# Patient Record
Sex: Male | Born: 1955
Health system: Southern US, Community
[De-identification: ages and names within clinical notes are randomized; demographics above are authoritative.]

## PROBLEM LIST (undated history)

## (undated) DIAGNOSIS — F419 Anxiety disorder, unspecified: Secondary | ICD-10-CM

## (undated) DIAGNOSIS — F32A Depression, unspecified: Secondary | ICD-10-CM

## (undated) DIAGNOSIS — F99 Mental disorder, not otherwise specified: Secondary | ICD-10-CM

## (undated) DIAGNOSIS — R569 Unspecified convulsions: Secondary | ICD-10-CM

## (undated) DIAGNOSIS — F329 Major depressive disorder, single episode, unspecified: Secondary | ICD-10-CM

## (undated) HISTORY — PX: HERNIA REPAIR: SHX51

---

## 2003-02-23 ENCOUNTER — Emergency Department (HOSPITAL_COMMUNITY): Admission: EM | Admit: 2003-02-23 | Discharge: 2003-02-23 | Payer: Self-pay | Admitting: Emergency Medicine

## 2010-06-27 ENCOUNTER — Inpatient Hospital Stay (HOSPITAL_COMMUNITY)
Admission: EM | Admit: 2010-06-27 | Discharge: 2010-07-01 | DRG: 580 | Disposition: A | Discharge status: Home / Self Care | Payer: Self-pay | Attending: Otolaryngology | Admitting: Otolaryngology

## 2010-06-27 ENCOUNTER — Emergency Department (HOSPITAL_COMMUNITY): Payer: Self-pay

## 2010-06-27 DIAGNOSIS — S21109A Unspecified open wound of unspecified front wall of thorax without penetration into thoracic cavity, initial encounter: Principal | ICD-10-CM | POA: Diagnosis present

## 2010-06-27 DIAGNOSIS — S0120XA Unspecified open wound of nose, initial encounter: Secondary | ICD-10-CM | POA: Diagnosis present

## 2010-06-27 DIAGNOSIS — Z5900 Homelessness unspecified: Secondary | ICD-10-CM

## 2010-06-27 DIAGNOSIS — S0180XA Unspecified open wound of other part of head, initial encounter: Secondary | ICD-10-CM | POA: Diagnosis present

## 2010-06-27 DIAGNOSIS — Y998 Other external cause status: Secondary | ICD-10-CM

## 2010-06-27 DIAGNOSIS — S1190XA Unspecified open wound of unspecified part of neck, initial encounter: Secondary | ICD-10-CM | POA: Diagnosis present

## 2010-06-27 DIAGNOSIS — S01501A Unspecified open wound of lip, initial encounter: Secondary | ICD-10-CM | POA: Diagnosis present

## 2010-06-27 DIAGNOSIS — S41009A Unspecified open wound of unspecified shoulder, initial encounter: Secondary | ICD-10-CM | POA: Diagnosis present

## 2010-06-27 DIAGNOSIS — D62 Acute posthemorrhagic anemia: Secondary | ICD-10-CM | POA: Diagnosis present

## 2010-06-27 DIAGNOSIS — F341 Dysthymic disorder: Secondary | ICD-10-CM | POA: Diagnosis present

## 2010-06-27 DIAGNOSIS — F1910 Other psychoactive substance abuse, uncomplicated: Secondary | ICD-10-CM | POA: Diagnosis present

## 2010-06-27 DIAGNOSIS — F411 Generalized anxiety disorder: Secondary | ICD-10-CM | POA: Diagnosis present

## 2010-06-27 LAB — COMPREHENSIVE METABOLIC PANEL
AST: 74 U/L — ABNORMAL HIGH (ref 0–37)
BUN: 6 mg/dL (ref 6–23)
CO2: 22 mEq/L (ref 19–32)
Calcium: 8.5 mg/dL (ref 8.4–10.5)
Chloride: 105 mEq/L (ref 96–112)
Creatinine, Ser: 0.91 mg/dL (ref 0.4–1.5)
GFR calc Af Amer: >60 mL/min (ref >60)
GFR calc non Af Amer: >60 mL/min (ref >60)
Total Bilirubin: 0.5 mg/dL (ref 0.3–1.2)

## 2010-06-27 LAB — POCT I-STAT, CHEM 8
BUN: 6 mg/dL (ref 6–23)
Calcium, Ion: 0.99 mmol/L — ABNORMAL LOW (ref 1.12–1.32)
Chloride: 106 meq/L (ref 96–112)
HCT: 37 % — ABNORMAL LOW (ref 39.0–52.0)
Sodium: 140 meq/L (ref 135–145)
TCO2: 22 mmol/L (ref 0–100)

## 2010-06-27 LAB — DIFFERENTIAL
Basophils Absolute: 0 10*3/uL (ref 0.0–0.1)
Basophils Relative: 0 % (ref 0–1)
Lymphocytes Relative: 15 % (ref 12–46)
Neutro Abs: 5.9 10*3/uL (ref 1.7–7.7)
Neutrophils Relative %: 77 % (ref 43–77)

## 2010-06-27 LAB — CBC
HCT: 27.2 % — ABNORMAL LOW (ref 39.0–52.0)
Hemoglobin: 9.2 g/dL — ABNORMAL LOW (ref 13.0–17.0)
Platelets: 123 10*3/uL — ABNORMAL LOW (ref 150–400)
RBC: 4.2 MIL/uL — ABNORMAL LOW (ref 4.22–5.81)
RBC: 3.11 MIL/uL — ABNORMAL LOW (ref 4.22–5.81)
RDW: 12.8 % (ref 11.5–15.5)
WBC: 6.9 10*3/uL (ref 4.0–10.5)
WBC: 7.7 10*3/uL (ref 4.0–10.5)

## 2010-06-27 LAB — BASIC METABOLIC PANEL
CO2: 23 mEq/L (ref 19–32)
Calcium: 7.5 mg/dL — ABNORMAL LOW (ref 8.4–10.5)
Chloride: 108 mEq/L (ref 96–112)
GFR calc Af Amer: >60 mL/min (ref >60)
Glucose, Bld: 120 mg/dL — ABNORMAL HIGH (ref 70–99)
Potassium: 4.6 mEq/L (ref 3.5–5.1)
Sodium: 135 mEq/L (ref 135–145)

## 2010-06-27 LAB — PROTIME-INR
INR: 1.13 (ref 0.00–1.49)
Prothrombin Time: 14.7 seconds (ref 11.6–15.2)

## 2010-06-27 LAB — ETHANOL: Alcohol, Ethyl (B): 28 mg/dL — ABNORMAL HIGH (ref 0–10)

## 2010-06-28 LAB — POCT I-STAT 4, (NA,K, GLUC, HGB,HCT)
Potassium: 3.9 meq/L (ref 3.5–5.1)
Sodium: 141 meq/L (ref 135–145)

## 2010-06-28 LAB — CBC
Hemoglobin: 9.8 g/dL — ABNORMAL LOW (ref 13.0–17.0)
MCH: 30.5 pg (ref 26.0–34.0)
MCHC: 34.4 g/dL (ref 30.0–36.0)
MCV: 88.8 fL (ref 78.0–100.0)
RBC: 3.21 MIL/uL — ABNORMAL LOW (ref 4.22–5.81)
RDW: 13.2 % (ref 11.5–15.5)
WBC: 10.5 10*3/uL (ref 4.0–10.5)

## 2010-07-01 LAB — TYPE AND SCREEN
ABO/RH(D): O POS
Unit division: 0
Unit division: 0
Unit division: 0
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
## 2012-05-01 ENCOUNTER — Emergency Department (HOSPITAL_COMMUNITY): Payer: Self-pay

## 2012-05-01 ENCOUNTER — Emergency Department (HOSPITAL_COMMUNITY)
Admission: EM | Admit: 2012-05-01 | Discharge: 2012-05-01 | Disposition: A | Discharge status: Home / Self Care | Payer: Self-pay | Attending: Emergency Medicine | Admitting: Emergency Medicine

## 2012-05-01 ENCOUNTER — Encounter (HOSPITAL_COMMUNITY): Payer: Self-pay | Admitting: *Deleted

## 2012-05-01 DIAGNOSIS — F17200 Nicotine dependence, unspecified, uncomplicated: Secondary | ICD-10-CM | POA: Insufficient documentation

## 2012-05-01 DIAGNOSIS — Z8659 Personal history of other mental and behavioral disorders: Secondary | ICD-10-CM | POA: Insufficient documentation

## 2012-05-01 DIAGNOSIS — Y929 Unspecified place or not applicable: Secondary | ICD-10-CM | POA: Insufficient documentation

## 2012-05-01 DIAGNOSIS — R42 Dizziness and giddiness: Secondary | ICD-10-CM | POA: Insufficient documentation

## 2012-05-01 DIAGNOSIS — W19XXXA Unspecified fall, initial encounter: Secondary | ICD-10-CM

## 2012-05-01 DIAGNOSIS — Y939 Activity, unspecified: Secondary | ICD-10-CM | POA: Insufficient documentation

## 2012-05-01 DIAGNOSIS — S40019A Contusion of unspecified shoulder, initial encounter: Secondary | ICD-10-CM | POA: Insufficient documentation

## 2012-05-01 DIAGNOSIS — W108XXA Fall (on) (from) other stairs and steps, initial encounter: Secondary | ICD-10-CM | POA: Insufficient documentation

## 2012-05-01 DIAGNOSIS — S0993XA Unspecified injury of face, initial encounter: Secondary | ICD-10-CM | POA: Insufficient documentation

## 2012-05-01 HISTORY — DX: Mental disorder, not otherwise specified: F99

## 2012-05-01 LAB — COMPREHENSIVE METABOLIC PANEL
ALT: 58 U/L — ABNORMAL HIGH (ref 0–53)
AST: 81 U/L — ABNORMAL HIGH (ref 0–37)
Albumin: 2.9 g/dL — ABNORMAL LOW (ref 3.5–5.2)
Alkaline Phosphatase: 76 U/L (ref 39–117)
BUN: 7 mg/dL (ref 6–23)
CO2: 25 mEq/L (ref 19–32)
Calcium: 9.1 mg/dL (ref 8.4–10.5)
Chloride: 108 mEq/L (ref 96–112)
Creatinine, Ser: 0.85 mg/dL (ref 0.50–1.35)
GFR calc Af Amer: >90 mL/min (ref >90)
GFR calc non Af Amer: >90 mL/min (ref >90)
Glucose, Bld: 84 mg/dL (ref 70–99)
Potassium: 3.9 mEq/L (ref 3.5–5.1)
Sodium: 138 mEq/L (ref 135–145)
Total Bilirubin: 1.1 mg/dL (ref 0.3–1.2)
Total Protein: 6.8 g/dL (ref 6.0–8.3)

## 2012-05-01 LAB — CBC WITH DIFFERENTIAL/PLATELET
Basophils Absolute: 0 10*3/uL (ref 0.0–0.1)
Basophils Relative: 0 % (ref 0–1)
Eosinophils Absolute: 0.1 10*3/uL (ref 0.0–0.7)
Eosinophils Relative: 3 % (ref 0–5)
HCT: 32.2 % — ABNORMAL LOW (ref 39.0–52.0)
Hemoglobin: 11.2 g/dL — ABNORMAL LOW (ref 13.0–17.0)
Lymphocytes Relative: 25 % (ref 12–46)
Lymphs Abs: 0.9 10*3/uL (ref 0.7–4.0)
MCH: 30.7 pg (ref 26.0–34.0)
MCHC: 34.8 g/dL (ref 30.0–36.0)
MCV: 88.2 fL (ref 78.0–100.0)
Monocytes Absolute: 0.4 10*3/uL (ref 0.1–1.0)
Monocytes Relative: 11 % (ref 3–12)
Neutro Abs: 2.2 10*3/uL (ref 1.7–7.7)
Neutrophils Relative %: 61 % (ref 43–77)
Platelets: 58 10*3/uL — ABNORMAL LOW (ref 150–400)
RBC: 3.65 MIL/uL — ABNORMAL LOW (ref 4.22–5.81)
RDW: 13.9 % (ref 11.5–15.5)
WBC: 3.6 10*3/uL — ABNORMAL LOW (ref 4.0–10.5)

## 2012-05-01 MED ORDER — IBUPROFEN 800 MG PO TABS: 800.0000 mg | ORAL_TABLET | Freq: Three times a day (TID) | ORAL | Status: DC
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
## 2012-05-01 NOTE — ED Provider Notes (Signed)
History   This chart was scribed for Benny Lennert, MD by Charolett Bumpers, ED Scribe. The patient was seen in room APA02/APA02. Patient's care was started at 1104.   CSN: [id number]  Arrival date & time 05/01/12  1050   First MD Initiated Contact with Patient 05/01/12 1104      Chief Complaint  Patient presents with  . Loss of Consciousness  . Fall   Daniel Gray is a 57 y.o. male who presents to the Emergency Department complaining of a fall that occurred 3 days ago. He states that he was standing when he lost consciousness and fell down approximately 25 stairs at home. He states that he woke up with blood in his nose and ears. He states that he felt dizzy initially prior to LOC. He states that he has been in bed since, trying to find a ride to the ED. He now complains of bilaterally shoulder pain, mild posterior neck pain and right hip pain. He reports he has been eating and drinking normally since fall. He denies any fever, chills, nausea or vomiting. He states that he doesn't have a PCP. He takes Zoloft, Haldol, Alprenolol and Doxaphene regularly. He denies any recent changes in his medications.   Patient is a 57 y.o. male presenting with fall. The history is provided by the patient.  Fall The accident occurred more than 2 days ago. The fall occurred while standing. The pain is present in the neck, left shoulder, right shoulder and right hip. The pain is moderate. Associated symptoms include loss of consciousness. Pertinent negatives include no fever, no abdominal pain, no nausea, no vomiting, no hematuria and no headaches. He has tried nothing for the symptoms.     Past Medical History  Diagnosis Date  . Chronic mental illness     Unspecified, Pt unable to give any further hx as to what kind.    Past Surgical History  Procedure Date  . Hernia repair     No family history on file.  History  Substance Use Topics  . Smoking status: Current Some Day Smoker  .  Smokeless tobacco: Not on file  . Alcohol Use: No      Review of Systems  Constitutional: Negative for fever, chills and fatigue.  HENT: Negative for congestion, sinus pressure and ear discharge.   Eyes: Negative for discharge.  Respiratory: Negative for cough.   Cardiovascular: Negative for chest pain.  Gastrointestinal: Negative for nausea, vomiting, abdominal pain and diarrhea.  Genitourinary: Negative for frequency and hematuria.  Musculoskeletal: Positive for arthralgias. Negative for back pain.  Skin: Negative for rash.  Neurological: Positive for loss of consciousness. Negative for seizures and headaches.  Hematological: Negative.   Psychiatric/Behavioral: Negative for hallucinations.  All other systems reviewed and are negative.    Allergies  Aspirin  Home Medications  No current outpatient prescriptions on file.  BP 123/73  Pulse 79  Temp 97.7 F (36.5 C) (Oral)  Resp 20  Ht 6' (1.829 m)  Wt 210 lb (95.255 kg)  BMI 28.48 kg/m2  SpO2 100%  Physical Exam  Nursing note and vitals reviewed. Constitutional: He is oriented to person, place, and time. He appears well-developed.  HENT:  Head: Normocephalic and atraumatic.  Right Ear: External ear normal.  Left Ear: External ear normal.  Nose: Nose normal.  Mouth/Throat: Oropharynx is clear and moist. No oropharyngeal exudate.       TM's normal bilaterally.   Eyes: Conjunctivae normal and EOM are normal. No  scleral icterus.  Neck: Neck supple. No thyromegaly present.       Mild posterior neck tenderness.   Cardiovascular: Normal rate, regular rhythm and normal heart sounds.  Exam reveals no gallop and no friction rub.   No murmur heard. Pulmonary/Chest: Effort normal and breath sounds normal. No stridor. He has no wheezes. He has no rales. He exhibits no tenderness.  Abdominal: He exhibits no distension. There is no tenderness. There is no rebound.  Musculoskeletal: Normal range of motion. He exhibits  tenderness. He exhibits no edema.       Bilateral shoulder tenderness and right inguinal tenderness with palpitation.   Lymphadenopathy:    He has no cervical adenopathy.  Neurological: He is alert and oriented to person, place, and time. Coordination normal.  Skin: No rash noted. No erythema.  Psychiatric: He has a normal mood and affect. His behavior is normal.    ED Course  Procedures (including critical care time)  DIAGNOSTIC STUDIES: Oxygen Saturation is 100% on room air, normal by my interpretation.    COORDINATION OF CARE:  11:14-Discussed planned course of treatment with the patient including a CT of head and c-spine, chest x-ray, pelvis x-ray and blood work, who is agreeable at this time.   Results for orders placed during the hospital encounter of 05/01/12  CBC WITH DIFFERENTIAL      Component Value Range   WBC 3.6 (*) 4.0 - 10.5 K/uL   RBC 3.65 (*) 4.22 - 5.81 MIL/uL   Hemoglobin 11.2 (*) 13.0 - 17.0 g/dL   HCT 13.0 (*) 86.5 - 78.4 %   MCV 88.2  78.0 - 100.0 fL   MCH 30.7  26.0 - 34.0 pg   MCHC 34.8  30.0 - 36.0 g/dL   RDW 69.6  29.5 - 28.4 %   Platelets 58 (*) 150 - 400 K/uL   Neutrophils Relative 61  43 - 77 %   Neutro Abs 2.2  1.7 - 7.7 K/uL   Lymphocytes Relative 25  12 - 46 %   Lymphs Abs 0.9  0.7 - 4.0 K/uL   Monocytes Relative 11  3 - 12 %   Monocytes Absolute 0.4  0.1 - 1.0 K/uL   Eosinophils Relative 3  0 - 5 %   Eosinophils Absolute 0.1  0.0 - 0.7 K/uL   Basophils Relative 0  0 - 1 %   Basophils Absolute 0.0  0.0 - 0.1 K/uL  COMPREHENSIVE METABOLIC PANEL      Component Value Range   Sodium 138  135 - 145 mEq/L   Potassium 3.9  3.5 - 5.1 mEq/L   Chloride 108  96 - 112 mEq/L   CO2 25  19 - 32 mEq/L   Glucose, Bld 84  70 - 99 mg/dL   BUN 7  6 - 23 mg/dL   Creatinine, Ser 1.32  0.50 - 1.35 mg/dL   Calcium 9.1  8.4 - 44.0 mg/dL   Total Protein 6.8  6.0 - 8.3 g/dL   Albumin 2.9 (*) 3.5 - 5.2 g/dL   AST 81 (*) 0 - 37 U/L   ALT 58 (*) 0 - 53 U/L    Alkaline Phosphatase 76  39 - 117 U/L   Total Bilirubin 1.1  0.3 - 1.2 mg/dL   GFR calc non Af Amer >90  >90 mL/min   GFR calc Af Amer >90  >90 mL/min    No results found.   No diagnosis found.    MDM  The chart was scribed for me under my direct supervision.  I personally performed the history, physical, and medical decision making and all procedures in the evaluation of this patient.Benny Lennert, MD 05/01/12 [phone]

## 2012-07-09 DIAGNOSIS — J029 Acute pharyngitis, unspecified: Secondary | ICD-10-CM | POA: Diagnosis not present

## 2012-07-09 DIAGNOSIS — L270 Generalized skin eruption due to drugs and medicaments taken internally: Secondary | ICD-10-CM | POA: Diagnosis not present

## 2012-07-09 DIAGNOSIS — F17200 Nicotine dependence, unspecified, uncomplicated: Secondary | ICD-10-CM | POA: Diagnosis not present

## 2012-07-09 DIAGNOSIS — Z888 Allergy status to other drugs, medicaments and biological substances status: Secondary | ICD-10-CM | POA: Diagnosis not present

## 2012-11-12 ENCOUNTER — Emergency Department (HOSPITAL_COMMUNITY): Payer: Medicare Other

## 2012-11-12 ENCOUNTER — Emergency Department (HOSPITAL_COMMUNITY)
Admission: EM | Admit: 2012-11-12 | Discharge: 2012-11-13 | Disposition: A | Discharge status: Other Acute Inpatient Hospital | Payer: Medicare Other | Source: Home / Self Care | Attending: Emergency Medicine | Admitting: Emergency Medicine

## 2012-11-12 ENCOUNTER — Encounter (HOSPITAL_COMMUNITY): Payer: Self-pay

## 2012-11-12 DIAGNOSIS — F1420 Cocaine dependence, uncomplicated: Principal | ICD-10-CM | POA: Diagnosis present

## 2012-11-12 DIAGNOSIS — K838 Other specified diseases of biliary tract: Secondary | ICD-10-CM | POA: Diagnosis not present

## 2012-11-12 DIAGNOSIS — F17200 Nicotine dependence, unspecified, uncomplicated: Secondary | ICD-10-CM | POA: Insufficient documentation

## 2012-11-12 DIAGNOSIS — R4182 Altered mental status, unspecified: Secondary | ICD-10-CM | POA: Diagnosis not present

## 2012-11-12 DIAGNOSIS — R161 Splenomegaly, not elsewhere classified: Secondary | ICD-10-CM | POA: Diagnosis not present

## 2012-11-12 DIAGNOSIS — F411 Generalized anxiety disorder: Secondary | ICD-10-CM | POA: Insufficient documentation

## 2012-11-12 DIAGNOSIS — Z8669 Personal history of other diseases of the nervous system and sense organs: Secondary | ICD-10-CM | POA: Insufficient documentation

## 2012-11-12 DIAGNOSIS — K828 Other specified diseases of gallbladder: Secondary | ICD-10-CM | POA: Diagnosis not present

## 2012-11-12 DIAGNOSIS — R1032 Left lower quadrant pain: Secondary | ICD-10-CM | POA: Insufficient documentation

## 2012-11-12 DIAGNOSIS — F1920 Other psychoactive substance dependence, uncomplicated: Secondary | ICD-10-CM

## 2012-11-12 DIAGNOSIS — F329 Major depressive disorder, single episode, unspecified: Secondary | ICD-10-CM | POA: Insufficient documentation

## 2012-11-12 DIAGNOSIS — Z79899 Other long term (current) drug therapy: Secondary | ICD-10-CM

## 2012-11-12 DIAGNOSIS — F10229 Alcohol dependence with intoxication, unspecified: Secondary | ICD-10-CM | POA: Insufficient documentation

## 2012-11-12 DIAGNOSIS — F3289 Other specified depressive episodes: Secondary | ICD-10-CM | POA: Insufficient documentation

## 2012-11-12 DIAGNOSIS — F1010 Alcohol abuse, uncomplicated: Secondary | ICD-10-CM

## 2012-11-12 DIAGNOSIS — F29 Unspecified psychosis not due to a substance or known physiological condition: Secondary | ICD-10-CM | POA: Diagnosis present

## 2012-11-12 DIAGNOSIS — F32A Depression, unspecified: Secondary | ICD-10-CM

## 2012-11-12 DIAGNOSIS — R443 Hallucinations, unspecified: Secondary | ICD-10-CM | POA: Insufficient documentation

## 2012-11-12 DIAGNOSIS — R45851 Suicidal ideations: Secondary | ICD-10-CM | POA: Insufficient documentation

## 2012-11-12 DIAGNOSIS — F1320 Sedative, hypnotic or anxiolytic dependence, uncomplicated: Secondary | ICD-10-CM | POA: Insufficient documentation

## 2012-11-12 HISTORY — DX: Unspecified convulsions: R56.9

## 2012-11-12 HISTORY — DX: Major depressive disorder, single episode, unspecified: F32.9

## 2012-11-12 HISTORY — DX: Depression, unspecified: F32.A

## 2012-11-12 HISTORY — DX: Anxiety disorder, unspecified: F41.9

## 2012-11-12 LAB — CBC
HCT: 31.3 % — ABNORMAL LOW (ref 39.0–52.0)
Hemoglobin: 11.1 g/dL — ABNORMAL LOW (ref 13.0–17.0)
MCH: 28.8 pg (ref 26.0–34.0)
MCHC: 35.5 g/dL (ref 30.0–36.0)
MCV: 81.3 fL (ref 78.0–100.0)
Platelets: 75 10*3/uL — ABNORMAL LOW (ref 150–400)
RBC: 3.85 MIL/uL — ABNORMAL LOW (ref 4.22–5.81)
RDW: 15.5 % (ref 11.5–15.5)
WBC: 4.9 10*3/uL (ref 4.0–10.5)

## 2012-11-12 LAB — ETHANOL: Alcohol, Ethyl (B): <11 mg/dL (ref 0–11)

## 2012-11-12 LAB — COMPREHENSIVE METABOLIC PANEL
ALT: 124 U/L — ABNORMAL HIGH (ref 0–53)
AST: 281 U/L — ABNORMAL HIGH (ref 0–37)
Albumin: 3.1 g/dL — ABNORMAL LOW (ref 3.5–5.2)
Alkaline Phosphatase: 80 U/L (ref 39–117)
BUN: 9 mg/dL (ref 6–23)
CO2: 25 mEq/L (ref 19–32)
Calcium: 8.9 mg/dL (ref 8.4–10.5)
Chloride: 100 mEq/L (ref 96–112)
Creatinine, Ser: 0.96 mg/dL (ref 0.50–1.35)
GFR calc Af Amer: >90 mL/min (ref >90)
GFR calc non Af Amer: >90 mL/min (ref >90)
Glucose, Bld: 117 mg/dL — ABNORMAL HIGH (ref 70–99)
Potassium: 3.7 mEq/L (ref 3.5–5.1)
Sodium: 132 mEq/L — ABNORMAL LOW (ref 135–145)
Total Bilirubin: 1.4 mg/dL — ABNORMAL HIGH (ref 0.3–1.2)
Total Protein: 7.2 g/dL (ref 6.0–8.3)

## 2012-11-12 LAB — RAPID URINE DRUG SCREEN, HOSP PERFORMED
Amphetamines: NOT DETECTED
Barbiturates: NOT DETECTED
Benzodiazepines: POSITIVE — AB
Cocaine: POSITIVE — AB
Opiates: NOT DETECTED
Tetrahydrocannabinol: NOT DETECTED

## 2012-11-12 LAB — AMMONIA: Ammonia: 50 umol/L (ref 11–60)

## 2012-11-12 LAB — SALICYLATE LEVEL: Salicylate Lvl: <2 mg/dL — ABNORMAL LOW (ref 2.8–20.0)

## 2012-11-12 LAB — ACETAMINOPHEN LEVEL: Acetaminophen (Tylenol), Serum: <15 ug/mL (ref 10–30)

## 2012-11-12 MED ORDER — IOHEXOL 300 MG/ML  SOLN
100.0000 mL | Freq: Once | INTRAMUSCULAR | Status: AC | PRN
  Administered 2012-11-12: 100 mL via INTRAVENOUS

## 2012-11-12 NOTE — ED Notes (Signed)
Pt resting in bed, responds to loud speech then drifts off again. Pt still appears obtunded.

## 2012-11-12 NOTE — ED Notes (Signed)
Sitter at bedside.

## 2012-11-12 NOTE — ED Notes (Signed)
Patient to ultrasound. Fannie Knee, NT escorted patient to radiology department for continuous safety monitoring.

## 2012-11-12 NOTE — ED Notes (Signed)
Pt states his sister Shepard General may be updated on his condition and gives approval to discuss pt's medical information. Misty Stanley called, [phone] and updated on pt's status.

## 2012-11-12 NOTE — ED Provider Notes (Addendum)
CSN: [id number]     Arrival date & time 11/12/12  1057 History  This chart was scribed for Daniel Crease, MD by Bennett Scrape, ED Scribe. This patient was seen in room APA17/APA17 and the patient's care was started at 11:07 AM.   Chief Complaint  Patient presents with  . Medical Clearance    The history is provided by the patient. No language interpreter was used.   HPI Comments: Daniel Gray is a 57 y.o. male who presents to the Emergency Department requesting detox from alcohol and illegal drugs. Pt states that he "needs help straightening up", because he has been drinking alcohol and using cocaine, marijuana and pills "hardcore" for the past 4 to 5 months. He admits to drinking one beer this morning. He states that he does feel depressed and has experienced SI intermittently within the past week. He denies SI currently. He is currently on Zoloft, Xanax, Haldol and Sinequan and denies any missed doses. He denies any other symptoms currently.  Past Medical History  Diagnosis Date  . Chronic mental illness     Unspecified, Pt unable to give any further hx as to what kind.   Past Surgical History  Procedure Laterality Date  . Hernia repair     No family history on file. History  Substance Use Topics  . Smoking status: Current Some Day Smoker  . Smokeless tobacco: Not on file  . Alcohol Use: No    Review of Systems  Gastrointestinal: Negative for vomiting and diarrhea.  Psychiatric/Behavioral: Positive for suicidal ideas. Negative for self-injury.  All other systems reviewed and are negative.    Allergies  Aspirin  Home Medications   Current Outpatient Rx  Name  Route  Sig  Dispense  Refill  . ALPRAZolam (XANAX) 0.5 MG tablet   Oral   Take 0.5 mg by mouth 5 (five) times daily. Prescribed: 1 tablet four times daily. May take 1 extra tablet if needed.         . doxepin (SINEQUAN) 10 MG capsule   Oral   Take 40 mg by mouth at bedtime.         .  haloperidol (HALDOL) 1 MG tablet   Oral   Take 2 mg by mouth at bedtime.         Marland Kitchen ibuprofen (ADVIL,MOTRIN) 800 MG tablet   Oral   Take 1 tablet (800 mg total) by mouth 3 (three) times daily.   21 tablet   0   . sertraline (ZOLOFT) 100 MG tablet   Oral   Take 200 mg by mouth daily.          Triage Vitals: BP 116/73  Pulse 86  Temp(Src) 98.5 F (36.9 C) (Oral)  Resp 18  Ht 6\' 1"  (1.854 m)  Wt 190 lb (86.183 kg)  BMI 25.07 kg/m2  SpO2 100%  Physical Exam  Nursing note and vitals reviewed. Constitutional: He is oriented to person, place, and time. He appears well-developed and well-nourished. No distress.  Smells of alcohol, appears intoxicated   HENT:  Head: Normocephalic and atraumatic.  Right Ear: Hearing normal.  Left Ear: Hearing normal.  Nose: Nose normal.  Mouth/Throat: Oropharynx is clear and moist and mucous membranes are normal.  Eyes: EOM are normal. Pupils are equal, round, and reactive to light.  Injected conjunctivae bilaterally   Neck: Normal range of motion. Neck supple.  Cardiovascular: Regular rhythm, S1 normal and S2 normal.  Exam reveals no gallop and no friction rub.  No murmur heard. Pulmonary/Chest: Effort normal and breath sounds normal. No respiratory distress. He exhibits no tenderness.  Abdominal: Soft. Normal appearance and bowel sounds are normal. There is no hepatosplenomegaly. There is no tenderness. There is no rebound, no guarding, no tenderness at McBurney's point and negative Murphy's sign. No hernia.  Musculoskeletal: Normal range of motion.  Neurological: He is alert and oriented to person, place, and time. He has normal strength. No cranial nerve deficit or sensory deficit. Coordination normal. GCS eye subscore is 4. GCS verbal subscore is 5. GCS motor subscore is 6.  Skin: Skin is warm, dry and intact. No rash noted. No cyanosis.  Psychiatric: He has a normal mood and affect. His speech is normal and behavior is normal. Thought  content normal.    ED Course   Procedures (including critical care time)  DIAGNOSTIC STUDIES: Oxygen Saturation is 100% on room air, normal by my interpretation.    COORDINATION OF CARE: 11:10 AM-Discussed treatment plan which includes CBC panel, CMP, UA and ethanol with pt at bedside and pt agreed to plan.   Labs Reviewed  CBC - Abnormal; Notable for the following:    RBC 3.85 (*)    Hemoglobin 11.1 (*)    HCT 31.3 (*)    Platelets 75 (*)    All other components within normal limits  COMPREHENSIVE METABOLIC PANEL - Abnormal; Notable for the following:    Sodium 132 (*)    Glucose, Bld 117 (*)    Albumin 3.1 (*)    AST 281 (*)    ALT 124 (*)    Total Bilirubin 1.4 (*)    All other components within normal limits  SALICYLATE LEVEL - Abnormal; Notable for the following:    Salicylate Lvl <2.0 (*)    All other components within normal limits  URINE RAPID DRUG SCREEN (HOSP PERFORMED) - Abnormal; Notable for the following:    Cocaine POSITIVE (*)    Benzodiazepines POSITIVE (*)    All other components within normal limits  ACETAMINOPHEN LEVEL  ETHANOL   US Abdomen Complete  11/12/2012   *RADIOLOGY REPORT*  Clinical Data:  Elevated LFTs, abdominal pain, particular left lower quadrant left back  ULTRASOUND ABDOMEN:  Technique:  Sonography of upper abdominal structures was performed.  Comparison:  None  Gallbladder:  Markedly distended without gross wall thickening. Question small amount of gallbladder sludge.  No definite gallstones or pericholecystic fluid identified.  No sonographic Murphy's sign.  Common bile duct:  Dilated, 11 mm diameter.  Distal CBD obscured by bowel gas.  No definite stones identified within the visualized portions of the CBD.  Liver:  Nodular margins with coarsened intrahepatic echogenicity most consistent with cirrhosis.  Hepatopetal portal venous flow. No focal hepatic mass identified.  Mild central intrahepatic biliary dilatation.  IVC:  Patent.  Multiple  mobile brightly echogenic foci are identified within the IVC, as well as the portal vein.  These are of uncertain etiology.  Pancreas:  Normal echogenicity.  Head incompletely visualized due to bowel gas.  No pancreatic ductal dilatation, calcifications or definite mass visualized.  Spleen:  Enlarged, 15.7 cm length with calculated volume of 956 ml. No focal splenic lesion  Right kidney:  11.8 cm length. Normal morphology without mass or hydronephrosis.  Left kidney:  13.6 cm length. Normal morphology without mass or hydronephrosis.  Aorta:  Normal caliber  Other:  No free fluid.  Upper abdominal varices noted at the midline perigastric.  IMPRESSION: Cirrhotic appearing liver with splenomegaly and  upper abdominal varices. Biliary dilatation and gallbladder distention of uncertain etiology.  Numerous nonspecific intraluminal mobile echogenic flow within IVC and portal vein, uncertain etiology; these could potentially be artifactual, be related to micro bubbles, unlikely prior ultrasound contrast administration (of which there is no record). Due to presence of biliary dilatation and gallbladder distention as well as the intraluminal echogenic foci of uncertain etiology within the IVC and portal vein, recommend CT imaging of the abdomen and pelvis with IV and oral contrast for further evaluation.  Findings called to Dr. Blinda Leatherwood on 11/12/2012 at 100 hours.   Original Report Authenticated By: Ulyses Southward, M.D.   Diagnosis: 1. Alcohol abuse 2. Drug abuse 3. Depression  MDM  Patient presents to the ER with complaints of increasing depression. Patient reports that he wants help to get off drugs and alcohol. Patient reports that he has been drunk for 4 or 5 months straight. He has a history of polysubstance drug abuse, alcoholism and mental illness. He reports he is currently taking all his medications but he has become more depressed lately, has been thinking about harming himself but has no plan.  Medical workup  revealed elevated LFTs consistent with chronic alcohol intake. Remainder of the labs were unremarkable. Ultrasound performed because of the LFT abnormality. Patient has findings on the ultrasound as outlined above. CT scan has been recommended, will be performed. Disposition pending CT scan. If no acute abnormalities, anticipate psychiatric treatment.  Signed out to Dr. Effie Shy to follow up CT scan.  I personally performed the services described in this documentation, which was scribed in my presence. The recorded information has been reviewed and is accurate.    Daniel Crease, MD 11/12/12 1513  Daniel Crease, MD 11/12/12 [phone]

## 2012-11-12 NOTE — ED Notes (Signed)
Pt reports that he wants helpt to "get off drugs and alcohol", was dropped off by friend, stated he was depressed, stated he is suicidal, buts denies any plan denies any hi.  Strong odor of etoh.

## 2012-11-12 NOTE — ED Notes (Signed)
Pt wanded by security at arrival, pt's cash and keys given to security for lock up, clothing bagged and secured in department.

## 2012-11-13 ENCOUNTER — Telehealth (HOSPITAL_COMMUNITY): Payer: Self-pay | Admitting: Licensed Clinical Social Worker

## 2012-11-13 ENCOUNTER — Emergency Department (HOSPITAL_COMMUNITY): Payer: Medicare Other

## 2012-11-13 ENCOUNTER — Inpatient Hospital Stay (HOSPITAL_COMMUNITY)
Admission: AD | Admit: 2012-11-13 | Discharge: 2012-11-27 | DRG: 897 | Disposition: A | Discharge status: Home / Self Care | Payer: Medicare Other | Source: Intra-hospital | Attending: Psychiatry | Admitting: Psychiatry

## 2012-11-13 ENCOUNTER — Encounter (HOSPITAL_COMMUNITY): Payer: Self-pay | Admitting: Emergency Medicine

## 2012-11-13 DIAGNOSIS — F29 Unspecified psychosis not due to a substance or known physiological condition: Secondary | ICD-10-CM | POA: Diagnosis present

## 2012-11-13 DIAGNOSIS — R4182 Altered mental status, unspecified: Secondary | ICD-10-CM | POA: Diagnosis not present

## 2012-11-13 DIAGNOSIS — F1420 Cocaine dependence, uncomplicated: Secondary | ICD-10-CM | POA: Diagnosis present

## 2012-11-13 MED ORDER — DOXEPIN HCL 10 MG PO CAPS
ORAL_CAPSULE | ORAL | Status: AC
  Filled 2012-11-13: qty 4

## 2012-11-13 MED ORDER — CHLORDIAZEPOXIDE HCL 25 MG PO CAPS
25.0000 mg | ORAL_CAPSULE | ORAL | Status: AC
  Administered 2012-11-17: 25 mg via ORAL
  Filled 2012-11-13 (×2): qty 1

## 2012-11-13 MED ORDER — ADULT MULTIVITAMIN W/MINERALS CH
1.0000 | ORAL_TABLET | Freq: Every day | ORAL | Status: DC
  Administered 2012-11-13 – 2012-11-27 (×15): 1 via ORAL
  Filled 2012-11-13 (×17): qty 1

## 2012-11-13 MED ORDER — LORAZEPAM 2 MG/ML IJ SOLN: 1.0000 mg | Freq: Four times a day (QID) | INTRAMUSCULAR | Status: DC | PRN

## 2012-11-13 MED ORDER — CHLORDIAZEPOXIDE HCL 25 MG PO CAPS: 25.0000 mg | ORAL_CAPSULE | Freq: Four times a day (QID) | ORAL | Status: AC | PRN

## 2012-11-13 MED ORDER — CHLORDIAZEPOXIDE HCL 25 MG PO CAPS
25.0000 mg | ORAL_CAPSULE | Freq: Four times a day (QID) | ORAL | Status: AC
  Administered 2012-11-13 – 2012-11-15 (×6): 25 mg via ORAL
  Filled 2012-11-13 (×6): qty 1

## 2012-11-13 MED ORDER — HYDROXYZINE HCL 25 MG PO TABS: 25.0000 mg | ORAL_TABLET | Freq: Four times a day (QID) | ORAL | Status: AC | PRN

## 2012-11-13 MED ORDER — ADULT MULTIVITAMIN W/MINERALS CH
1.0000 | ORAL_TABLET | Freq: Every day | ORAL | Status: DC
  Administered 2012-11-13: 1 via ORAL
  Filled 2012-11-13: qty 1

## 2012-11-13 MED ORDER — CHLORDIAZEPOXIDE HCL 25 MG PO CAPS
25.0000 mg | ORAL_CAPSULE | Freq: Every day | ORAL | Status: AC
  Administered 2012-11-18: 25 mg via ORAL
  Filled 2012-11-13: qty 1

## 2012-11-13 MED ORDER — VITAMIN B-1 100 MG PO TABS
100.0000 mg | ORAL_TABLET | Freq: Every day | ORAL | Status: DC
  Administered 2012-11-13: 100 mg via ORAL
  Filled 2012-11-13: qty 1

## 2012-11-13 MED ORDER — SERTRALINE HCL 50 MG PO TABS
200.0000 mg | ORAL_TABLET | Freq: Every day | ORAL | Status: DC
  Administered 2012-11-13: 200 mg via ORAL
  Filled 2012-11-13 (×2): qty 2

## 2012-11-13 MED ORDER — DOXEPIN HCL 10 MG PO CAPS
40.0000 mg | ORAL_CAPSULE | Freq: Every day | ORAL | Status: DC
  Administered 2012-11-13: 40 mg via ORAL
  Filled 2012-11-13 (×2): qty 4

## 2012-11-13 MED ORDER — LOPERAMIDE HCL 2 MG PO CAPS: 2.0000 mg | ORAL_CAPSULE | ORAL | Status: AC | PRN

## 2012-11-13 MED ORDER — HALOPERIDOL 2 MG PO TABS
ORAL_TABLET | ORAL | Status: AC
  Filled 2012-11-13: qty 1

## 2012-11-13 MED ORDER — HALOPERIDOL 2 MG PO TABS
2.0000 mg | ORAL_TABLET | Freq: Every day | ORAL | Status: DC
  Administered 2012-11-13: 2 mg via ORAL
  Filled 2012-11-13 (×2): qty 1

## 2012-11-13 MED ORDER — ALUM & MAG HYDROXIDE-SIMETH 200-200-20 MG/5ML PO SUSP: 30.0000 mL | ORAL | Status: DC | PRN

## 2012-11-13 MED ORDER — TRAZODONE HCL 50 MG PO TABS
50.0000 mg | ORAL_TABLET | Freq: Every evening | ORAL | Status: DC | PRN
  Administered 2012-11-13 – 2012-11-22 (×6): 50 mg via ORAL
  Filled 2012-11-13 (×29): qty 1

## 2012-11-13 MED ORDER — THIAMINE HCL 100 MG/ML IJ SOLN: 100.0000 mg | Freq: Once | INTRAMUSCULAR | Status: DC

## 2012-11-13 MED ORDER — MAGNESIUM HYDROXIDE 400 MG/5ML PO SUSP
30.0000 mL | Freq: Every day | ORAL | Status: DC | PRN
  Administered 2012-11-21 – 2012-11-24 (×3): 30 mL via ORAL

## 2012-11-13 MED ORDER — VITAMIN B-1 100 MG PO TABS
100.0000 mg | ORAL_TABLET | Freq: Every day | ORAL | Status: DC
  Administered 2012-11-14 – 2012-11-27 (×14): 100 mg via ORAL
  Filled 2012-11-13 (×15): qty 1

## 2012-11-13 MED ORDER — ONDANSETRON 4 MG PO TBDP: 4.0000 mg | ORAL_TABLET | Freq: Four times a day (QID) | ORAL | Status: AC | PRN

## 2012-11-13 MED ORDER — LORAZEPAM 1 MG PO TABS: 1.0000 mg | ORAL_TABLET | Freq: Four times a day (QID) | ORAL | Status: DC | PRN

## 2012-11-13 MED ORDER — ACETAMINOPHEN 325 MG PO TABS: 650.0000 mg | ORAL_TABLET | Freq: Four times a day (QID) | ORAL | Status: DC | PRN

## 2012-11-13 MED ORDER — THIAMINE HCL 100 MG/ML IJ SOLN: 100.0000 mg | Freq: Every day | INTRAMUSCULAR | Status: DC

## 2012-11-13 MED ORDER — CHLORDIAZEPOXIDE HCL 25 MG PO CAPS
25.0000 mg | ORAL_CAPSULE | Freq: Three times a day (TID) | ORAL | Status: AC
  Administered 2012-11-15 – 2012-11-16 (×3): 25 mg via ORAL
  Filled 2012-11-13 (×3): qty 1

## 2012-11-13 MED ORDER — FOLIC ACID 1 MG PO TABS
1.0000 mg | ORAL_TABLET | Freq: Every day | ORAL | Status: DC
  Administered 2012-11-13: 1 mg via ORAL
  Filled 2012-11-13: qty 1

## 2012-11-13 NOTE — ED Notes (Signed)
Pt calm and cooperative, very appreciative of care received.

## 2012-11-13 NOTE — ED Notes (Signed)
Per Jessie Foot, Pt has been accepted by [facility] by Dr. Lucianne Muss, Room # 300-02.  States room is not ready but when call when it is.

## 2012-11-13 NOTE — ED Notes (Signed)
Pt has been very groggy, drowsy, unable to complete sentences without falling asleep, awake only to sternal rub and vigorous touch since my initial assessment.  edp notified and orders received.

## 2012-11-13 NOTE — Progress Notes (Signed)
Patient ID: Daniel Gray, male   DOB: 29-Jul-1955, 57 y.o.   MRN: [id number]  Admission Note: Patient admitted voluntarily for ETOH, and cocaine detox. Patient lethargic during assessment, falling asleep multiple times and difficult for staff to keep awake for questioning. Pt snoring and sitting in chair with eyes closed. Pt awakens to loud voices and answers appropriately when able to focus. Pt given Haldol PTA. Respirations regular and unlabored. No distress noted. CIWA= 0 at this time. Q 15 minute safety checks initiated. Pt placed in the bed in his room. Charity, RN given report.

## 2012-11-13 NOTE — Progress Notes (Signed)
Writer went to greet this new pt after arriving onto the unit. Pt is currently in bed resting with eyes closed. Writer will continue to monitor pt. Writer will alert pt of QHS meds at med pass.

## 2012-11-13 NOTE — ED Notes (Signed)
Pt continues to sleep. Pt awakens to verbal stimulation.

## 2012-11-13 NOTE — ED Notes (Signed)
Pt alert and oriented at this time.  Ambulatory to restroom.  nad noted.  Sitter at bedside.

## 2012-11-13 NOTE — Tx Team (Signed)
Initial Interdisciplinary Treatment Plan  PATIENT STRENGTHS: (choose at least two) Physical Health  PATIENT STRESSORS: Substance abuse   PROBLEM LIST: Problem List/Patient Goals Date to be addressed Date deferred Reason deferred Estimated date of resolution  Substance Abuse 11/13/12     Depression 11/13/12                                                DISCHARGE CRITERIA:  Motivation to continue treatment in a less acute level of care Need for constant or close observation no longer present  PRELIMINARY DISCHARGE PLAN: Outpatient therapy Return to previous living arrangement  PATIENT/FAMIILY INVOLVEMENT: This treatment plan has been presented to and reviewed with the patient, Daniel Gray, and/or family member.  The patient and family have been given the opportunity to ask questions and make suggestions.  Renaee Munda 11/13/2012, 9:17 PM

## 2012-11-13 NOTE — ED Provider Notes (Signed)
Patient reportedly did well overnight. He is without complaints this morning. Patient was evaluated by psychiatry and has been accepted for transfer to [facility] for further treatment.  Gilda Crease, MD 11/13/12 [phone]

## 2012-11-13 NOTE — ED Notes (Signed)
Per [place], pts bed is ready at [facility].

## 2012-11-13 NOTE — BH Assessment (Signed)
Assessment Note  Daniel Gray is an 57 y.o. male  who presents to [facility] Emergency Department requesting detox from alcohol and illegal drugs , per EPD notes. This Clinical research associate completed a tele psych on this patient. Pt says that he was dropped off at the ED by a family members. According to ED notes, pt states that he "needs help straightening up", because he has been drinking alcohol and using cocaine, marijuana and pills "hardcore" for the past 4 to 5 months. He admits to drinking one beer this morning. Writer attempted to assess patient in order to obtain additional information about his substance use, however; he was reluctant to discuss details. Pt was not very forthcoming. Patient was drowsy.  Pt does feel depressed and admits to stressors. When asked to discuss his stressors patient responds, "It's everything and I can't really just says one thing". He has experienced SI intermittently within the past week. He admits to [facility] currently but no plan. He does not have a history of prior attempts. Patient denies having a outpatient provider. He is currently on Zoloft, Xanax, Haldol and Sinequan and denies any missed doses. Pt unable to recall who is providing his medication management. He denies any other symptoms currently. Pt reports intermit ant auditory hallucinations of voices telling him to hurt other people. Pt denies current psychotic symptoms.   Axis I: Major Depressive Disorder, Recurrent, Severe without psychotic features; Polysubstance  Axis II: Deferred Axis III:  Past Medical History  Diagnosis Date  . Chronic mental illness     Unspecified, Pt unable to give any further hx as to what kind.  . Depression   . Seizures   . Anxiety    Axis IV: other psychosocial or environmental problems, problems related to social environment, problems with access to health care services and problems with primary support group Axis V: 31-40 impairment in reality testing  Past Medical History:  Past  Medical History  Diagnosis Date  . Chronic mental illness     Unspecified, Pt unable to give any further hx as to what kind.  . Depression   . Seizures   . Anxiety     Past Surgical History  Procedure Laterality Date  . Hernia repair      Family History: No family history on file.  Social History:  reports that he has been smoking Cigarettes.  He has been smoking about 0.00 packs per day. He does not have any smokeless tobacco history on file. He reports that  drinks alcohol. He reports that he uses illicit drugs (Marijuana and Cocaine).  Additional Social History:  Alcohol / Drug Use Pain Medications: SEE MAR Prescriptions: SEE MAR Over the Counter: SEE MAR History of alcohol / drug use?: Yes Substance #1 Name of Substance 1: Cocaine, per nurse's triage notes  1 - Age of First Use: Unk, pt did not disclose 1 - Amount (size/oz): Unk 1 - Frequency: Unk 1 - Duration: Unk  1 - Last Use / Amount: Unk Substance #2 Name of Substance 2: Cocaine , per nurse's triage notes  2 - Age of First Use: Unk 2 - Amount (size/oz): Unk 2 - Frequency: Unk  2 - Duration: Unk  2 - Last Use / Amount: Unk  Substance #3 Name of Substance 3: THC,  per nurse's triage notes  3 - Age of First Use: Unk 3 - Amount (size/oz): Unk  3 - Frequency: Unk  3 - Duration: Unk  3 - Last Use / Amount: Unk  CIWA: CIWA-Ar BP: 117/59 mmHg Pulse Rate: 94 Nausea and Vomiting: no nausea and no vomiting Tactile Disturbances: none Tremor: no tremor Auditory Disturbances: not present Paroxysmal Sweats: no sweat visible Visual Disturbances: not present Anxiety: no anxiety, at ease Headache, Fullness in Head: none present Agitation: normal activity Orientation and Clouding of Sensorium: oriented and can do serial additions CIWA-Ar Total: 0 COWS:    Allergies:  Allergies  Allergen Reactions  . Aspirin Other (See Comments)    Stomach bleed.    Home Medications:  (Not in a hospital admission)  OB/GYN  Status:  No LMP for male patient.  General Assessment Data Location of Assessment: [facility] ED Is this a Tele or Face-to-Face Assessment?: Tele Assessment Is this an Initial Assessment or a Re-assessment for this encounter?: Initial Assessment Living Arrangements: Alone Can pt return to current living arrangement?: Yes Admission Status: Voluntary Is patient capable of signing voluntary admission?: Yes Transfer from: Acute Hospital Referral Source: Self/Family/Friend     Risk to self Suicidal Ideation: Yes-Currently Present Suicidal Intent: Yes-Currently Present Is patient at risk for suicide?: Yes Suicidal Plan?: No Access to Means: No What has been your use of drugs/alcohol within the last 12 months?:  (patient admits to substance use; pt would not disclose detai) Previous Attempts/Gestures: No How many times?:  (no previous attempts/gestures) Other Self Harm Risks:  (none reported) Triggers for Past Attempts: Other (Comment) (no previous attempts/gestures) Intentional Self Injurious Behavior: None Family Suicide History: No Recent stressful life event(s): Other (Comment) (pt sts, "Everything" but would not disclose any details ) Persecutory voices/beliefs?: No Depression: Yes Depression Symptoms: Feeling angry/irritable;Feeling worthless/self pity;Loss of interest in usual pleasures;Fatigue;Isolating;Tearfulness Substance abuse history and/or treatment for substance abuse?: No Suicide prevention information given to non-admitted patients: Not applicable  Risk to Others Homicidal Ideation: No Thoughts of Harm to Others: No Current Homicidal Intent: No Current Homicidal Plan: No Access to Homicidal Means: No Identified Victim:  (n/a) History of harm to others?: No Assessment of Violence: None Noted Violent Behavior Description:  (patient is calm and cooperative) Does patient have access to weapons?: No Criminal Charges Pending?: No Does patient have a court date:  No  Psychosis Hallucinations: None noted Delusions: None noted  Mental Status Report Appear/Hygiene: Disheveled Eye Contact: Good Motor Activity: Freedom of movement Speech: Logical/coherent Level of Consciousness: Alert Mood: Depressed Affect: Appropriate to circumstance Anxiety Level: None Thought Processes: Coherent Judgement: Unimpaired Orientation: Person;Place;Situation;Time Obsessive Compulsive Thoughts/Behaviors: None  Cognitive Functioning Concentration: Decreased Memory: Recent Intact;Remote Intact IQ: Average Insight: Fair Impulse Control: Fair Appetite: Poor Weight Loss:  (none reported) Weight Gain:  (none reported) Sleep: Decreased Total Hours of Sleep:  (varies ) Vegetative Symptoms: None  ADLScreening [facility] Assessment Services) Patient's cognitive ability adequate to safely complete daily activities?: Yes Patient able to express need for assistance with ADLs?: No Independently performs ADLs?: No  Prior Inpatient Therapy Prior Inpatient Therapy: No Prior Therapy Dates:  (n/a) Prior Therapy Facilty/Provider(s):  (n/a) Reason for Treatment:  (n/a)  Prior Outpatient Therapy Prior Outpatient Therapy: No Prior Therapy Dates:  (n/a) Prior Therapy Facilty/Provider(s):  (n/a) Reason for Treatment:  (n/a)  ADL Screening (condition at time of admission) Patient's cognitive ability adequate to safely complete daily activities?: Yes Is the patient deaf or have difficulty hearing?: No Does the patient have difficulty seeing, even when wearing glasses/contacts?: No Does the patient have difficulty concentrating, remembering, or making decisions?: No Patient able to express need for assistance with ADLs?: No Does the patient have difficulty dressing or  bathing?: No Independently performs ADLs?: No Communication: Independent Dressing (OT): Independent Grooming: Independent Feeding: Independent Bathing: Independent Toileting: Independent In/Out Bed:  Independent Walks in Home: Independent       Abuse/Neglect Assessment (Assessment to be complete while patient is alone) Physical Abuse: Denies Verbal Abuse: Denies Sexual Abuse: Denies Exploitation of patient/patient's resources: Denies       Nutrition Screen- [facility] Patient's home diet: Regular  Additional Information 1:1 In Past 12 Months?: No CIRT Risk: No Elopement Risk: No Does patient have medical clearance?: No     Disposition:  Disposition Initial Assessment Completed for this Encounter: Yes Disposition of Patient: Inpatient treatment program Type of inpatient treatment program: Adult  On Site Evaluation by:   Reviewed with Physician:    Octaviano Batty 11/13/2012 1:06 PM

## 2012-11-13 NOTE — BH Assessment (Signed)
Pt has been accepted [facility] by Dr. Lucianne Muss. The attending psychiatrist is Dr. Geoffery Lyons. The room # is 300-02. Pt's call report # is [phone]. Writer made patient's nurse Lanora Manis aware of patient's disposition. Also made patient's nurse Lanora Manis aware that patient's bed is not available at this time but when the bed is ready she will be notified.

## 2012-11-14 ENCOUNTER — Encounter (HOSPITAL_COMMUNITY): Payer: Self-pay | Admitting: Psychiatry

## 2012-11-14 DIAGNOSIS — F29 Unspecified psychosis not due to a substance or known physiological condition: Secondary | ICD-10-CM

## 2012-11-14 DIAGNOSIS — F1420 Cocaine dependence, uncomplicated: Principal | ICD-10-CM | POA: Diagnosis present

## 2012-11-14 MED ORDER — NICOTINE 21 MG/24HR TD PT24
21.0000 mg | MEDICATED_PATCH | Freq: Every day | TRANSDERMAL | Status: DC
  Administered 2012-11-14 – 2012-11-27 (×14): 21 mg via TRANSDERMAL
  Filled 2012-11-14 (×16): qty 1

## 2012-11-14 NOTE — Progress Notes (Addendum)
Recreation Therapy Notes  Date: 08.07.2014 Time: 3:00pm Location: 300 Hall Dayroom  Group Topic: Communication, Team Building, Problem Solving  Goal Area(s) Addresses:  Patient will work effectively in teams to accomplish shared goal. Patient will identify skills used to make activity successful. Patient will relate skills used in group session to positive lifestyle changes.  Behavioral Response: Did not attend.  Marykay Lex Mindie Rawdon, LRT/CTRS  Eleisha Branscomb L 11/14/2012 9:08 PM

## 2012-11-14 NOTE — Progress Notes (Signed)
Patient did not attend the evening karaoke group. Pt remained in bed during group.  
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
## 2012-11-14 NOTE — [facility] Group Notes (Signed)
[facility] LCSW Group Therapy  11/14/2012 2:16 PM  Type of Therapy:  Group Therapy  Participation Level:  Did Not Attend  Smart, Daniel Gray 11/14/2012, 2:16 PM  

## 2012-11-14 NOTE — Progress Notes (Signed)
D   Pt is in bed resting with his eyes closed  Not distress noted   It was reported to me that this pt has auditory and visual hallucinations  He said he sees a man walking a black dog with red eyes   It was reported pt has been in bed most of the day  A   Will continue to monitor Q 15 min and will pass information about hallucinations to next shift R   Pt safe at present

## 2012-11-14 NOTE — Progress Notes (Signed)
D:  Patient up some today.  Has been sleepy much of the day.  Very slow to respond when asked questions.  Has not attended groups today.  Denies depression or hopelessness, but states he has some mild to moderate anxiety.  He reports sleep and appetite are good.  A:  Medications given as prescribed.  Encouraged patient to be up and attending groups as much as possible.  R:  Cooperative with staff.  Not interacting much at this time.  Tolerating detox well.  Safety is maintained with 15 minute checks.

## 2012-11-14 NOTE — [facility] Suicide Risk Assessment (Signed)
Suicide Risk Assessment  Admission Assessment     Nursing information obtained from:  Patient Demographic factors:  Low socioeconomic status;Living alone;Unemployed Current Mental Status:  NA Loss Factors:  Financial problems / change in socioeconomic status Historical Factors:  Family history of mental illness or substance abuse Risk Reduction Factors:  Positive therapeutic relationship  CLINICAL FACTORS:   Alcohol/Substance Abuse/Dependencies Currently Psychotic  COGNITIVE FEATURES THAT CONTRIBUTE TO RISK:  Closed-mindedness Polarized thinking Thought constriction (tunnel vision)    SUICIDE RISK:   Moderate:  Frequent suicidal ideation with limited intensity, and duration, some specificity in terms of plans, no associated intent, good self-control, limited dysphoria/symptomatology, some risk factors present, and identifiable protective factors, including available and accessible social support.  PLAN OF CARE: Supportive approach/coping skills/relapse prevention                               Librium Detox (for Benzo withdrawal)                               Reassess psychotic symptoms  I certify that inpatient services furnished can reasonably be expected to improve the patient's condition.  Drayke Grabel A 11/14/2012, 3:31 PM

## 2012-11-15 MED ORDER — GABAPENTIN 100 MG PO CAPS
100.0000 mg | ORAL_CAPSULE | Freq: Three times a day (TID) | ORAL | Status: DC
  Administered 2012-11-15 – 2012-11-16 (×2): 100 mg via ORAL
  Filled 2012-11-15 (×5): qty 1

## 2012-11-15 MED ORDER — METHOCARBAMOL 500 MG PO TABS
500.0000 mg | ORAL_TABLET | Freq: Three times a day (TID) | ORAL | Status: DC
  Administered 2012-11-15 – 2012-11-19 (×8): 500 mg via ORAL
  Filled 2012-11-15 (×15): qty 1

## 2012-11-15 NOTE — Progress Notes (Addendum)
[facility] MD Progress Note  11/15/2012 1:47 PM Margaret Staggs  MRN:  [id number]  Subjective:  Linford is endorsing severe tremors and back pains. He called his symptoms "feeling the dope sickness". He is observed visibly shaking. He says he is having difficulties holding his spoon and cup during lunch. He currently denies any SIHI, AVH.   Diagnosis:   Axis I: Cocaine dependence, psychosis Axis II: Deferred Axis III:  Past Medical History  Diagnosis Date  . Chronic mental illness     Unspecified, Pt unable to give any further hx as to what kind.  . Depression   . Seizures   . Anxiety    Axis IV: Chronic substance abuse Axis V: 41-50 serious symptoms  ADL's:  Fairly intact  Sleep: Good  Appetite:  Good  Suicidal Ideation:  Plan:  Denies Intent:  Denies Means:  Denies Homicidal Ideation:  Plan:  Denies Intent:  Denies Means:  Denies  AEB (as evidenced by):   Psychiatric Specialty Exam: Review of Systems  Constitutional: Negative.   HENT: Negative.   Eyes: Negative.   Respiratory: Negative.   Cardiovascular: Negative.   Gastrointestinal: Negative.   Genitourinary: Negative.   Skin: Negative.   Neurological: Positive for dizziness and tremors.  Endo/Heme/Allergies: Negative.   Psychiatric/Behavioral: Positive for substance abuse (Cocaine depndence). Negative for depression, suicidal ideas, hallucinations and memory loss. The patient is nervous/anxious (Currently being discharged with medication) and has insomnia (Currently being stabilized with medication).     Blood pressure 105/63, pulse 92, temperature 98 F (36.7 C), temperature source Oral, resp. rate 16, height 6\' 1"  (1.854 m), weight 85 kg (187 lb 6.3 oz).Body mass index is 24.73 kg/(m^2).  General Appearance: Casual and Fairly Groomed  Patent attorney::  Good  Speech:  Clear and Coherent and Slow  Volume:  Normal  Mood:  "I'm hurting"  Affect:  Flat  Thought Process:  Coherent  Orientation:  Full (Time, Place, and  Person)  Thought Content:  Rumination  Suicidal Thoughts:  No  Homicidal Thoughts:  No  Memory:  Immediate;   Good Recent;   Fair Remote;   Fair  Judgement:  Impaired  Insight:  Fair  Psychomotor Activity:  Tremor  Concentration:  Fair  Recall:  Fair  Akathisia:  No  Handed:  Right  AIMS (if indicated):     Assets:  Desire for Improvement  Sleep:  Number of Hours: 6.75   Current Medications: Current Facility-Administered Medications  Medication Dose Route Frequency Provider Last Rate Last Dose  . acetaminophen (TYLENOL) tablet 650 mg  650 mg Oral Q6H PRN Kerry Hough, PA-C      . alum & mag hydroxide-simeth (MAALOX/MYLANTA) 200-200-20 MG/5ML suspension 30 mL  30 mL Oral Q4H PRN Kerry Hough, PA-C      . chlordiazePOXIDE (LIBRIUM) capsule 25 mg  25 mg Oral Q6H PRN Kerry Hough, PA-C      . chlordiazePOXIDE (LIBRIUM) capsule 25 mg  25 mg Oral TID Kerry Hough, PA-C   25 mg at 11/15/12 1259   Followed by  . [START ON 11/16/2012] chlordiazePOXIDE (LIBRIUM) capsule 25 mg  25 mg Oral BH-qamhs Spencer E Simon, PA-C       Followed by  . [START ON 11/18/2012] chlordiazePOXIDE (LIBRIUM) capsule 25 mg  25 mg Oral Daily Kerry Hough, PA-C      . hydrOXYzine (ATARAX/VISTARIL) tablet 25 mg  25 mg Oral Q6H PRN Kerry Hough, PA-C      . loperamide (  IMODIUM) capsule 2-4 mg  2-4 mg Oral PRN Kerry Hough, PA-C      . magnesium hydroxide (MILK OF MAGNESIA) suspension 30 mL  30 mL Oral Daily PRN Kerry Hough, PA-C      . multivitamin with minerals tablet 1 tablet  1 tablet Oral Daily Kerry Hough, PA-C   1 tablet at 11/15/12 0758  . nicotine (NICODERM CQ - dosed in mg/24 hours) patch 21 mg  21 mg Transdermal Daily Rachael Fee, MD   21 mg at 11/15/12 1301  . ondansetron (ZOFRAN-ODT) disintegrating tablet 4 mg  4 mg Oral Q6H PRN Kerry Hough, PA-C      . thiamine (B-1) injection 100 mg  100 mg Intramuscular Once [employer], PA-C      . thiamine (VITAMIN B-1) tablet 100  mg  100 mg Oral Daily Kerry Hough, PA-C   100 mg at 11/15/12 0758  . traZODone (DESYREL) tablet 50 mg  50 mg Oral QHS,MR X 1 Kerry Hough, PA-C   50 mg at 11/14/12 2120    Lab Results: No results found for this or any previous visit (from the past 48 hour(s)).  Physical Findings: AIMS: Facial and Oral Movements Muscles of Facial Expression: None, normal Lips and Perioral Area: None, normal Jaw: None, normal Tongue: None, normal,Extremity Movements Upper (arms, wrists, hands, fingers): None, normal Lower (legs, knees, ankles, toes): None, normal, Trunk Movements Neck, shoulders, hips: None, normal, Overall Severity Severity of abnormal movements (highest score from questions above): None, normal Incapacitation due to abnormal movements: None, normal Patient's awareness of abnormal movements (rate only patient's report): No Awareness, Dental Status Current problems with teeth and/or dentures?: No Does patient usually wear dentures?: No  CIWA:  CIWA-Ar Total: 0 COWS:  COWS Total Score: 0  Treatment Plan Summary: Daily contact with patient to assess and evaluate symptoms and progress in treatment Medication management  Plan: Supportive approach/coping skills/relapse prevention. Initiate Neurontin 100 mg tid for anxiety/pain management. Add Robaxin 500 mg tid for pain x 5 days. Encouraged out of room, participation in group sessions and application of coping skills when distressed. Will continue to monitor response to/adverse effects of medications in use to assure effectiveness. Continue to monitor mood, behavior and interaction with staff and other patients. Continue current plan of care.  Medical Decision Making Problem Points:  Review of last therapy session (1) and Review of psycho-social stressors (1) Data Points:  Review of medication regiment & side effects (2) Review of new medications or change in dosage (2)  I certify that inpatient services furnished can  reasonably be expected to improve the patient's condition.   Armandina Stammer I, PMHNP-BC 11/15/2012, 1:47 PM

## 2012-11-15 NOTE — Progress Notes (Signed)
Date: 11/15/2012  Time: 11:00 AM  Group Topic/Focus:  Relapse Prevention Planning: The focus of this group is to define relapse and discuss the need for planning to combat relapse.  Participation Level: Did Not Attend  Participation Quality:  Affect:  Cognitive:  Insight:  Engagement in Group:  Modes of Intervention:  Additional Comments: Pt was in the bed sleeping, refused to attend.  Isla Pence M  11/15/2012, 4:12 PM

## 2012-11-15 NOTE — Progress Notes (Signed)
Adult Psychoeducational Group Note  Date:  11/15/2012 Time: 10:00 AM Group Topic/Focus:  Relapse Prevention Planning:   The focus of this group is to define relapse and discuss the need for planning to combat relapse.  Participation Level:  Did Not Attend  Participation Quality:    Affect:    Cognitive:    Insight:   Engagement in Group:    Modes of Intervention:    Additional Comments:  Pt was in the bed sleeping, refused to attend.  Isla Pence M 11/15/2012, 4:12 PM

## 2012-11-15 NOTE — [facility] Group Notes (Signed)
[facility] LCSW Aftercare Discharge Planning Group Note   11/15/2012 9:18 AM  Participation Quality:  DID NOT ATTEND   [name], Daniel Hammad   

## 2012-11-15 NOTE — Clinical Social Work Note (Signed)
Daniel [name] refused to complete PSA with CSW today. He presents as disoriented, confused, and agitated today.

## 2012-11-15 NOTE — Progress Notes (Signed)
D) Pt stays in his room, to himself. Did initiate one conversation with this Clinical research associate about the food in [employer]. Has a tattoo on his arm and Pt told me about the woman and how he loved her and how she died. Eye contact is poor, mood is depressed. States he feels very tired. Rates his depression at a 5 and his hopelessness at a 3. Rates his anxiety at a 3. A)) Given support and reassurance. Encouraged to attend the groups.  R) Denies SI and HI. Stays to himself.

## 2012-11-15 NOTE — Tx Team (Signed)
Interdisciplinary Treatment Plan Update (Adult)  Date: 11/15/2012   Time Reviewed: 9:59 AM  Progress in Treatment:  Attending groups: No. Participating in groups:  No. Taking medication as prescribed: Yes  Tolerating medication: Yes  Family/Significant othe contact made: Not yet. SPE required for this pt.  Patient understands diagnosis: Yes, AEB seeking treatment for SI, substance abuse, and ETOH detox.  Discussing patient identified problems/goals with staff: Yes  Medical problems stabilized or resolved: Yes  Denies suicidal/homicidal ideation: No. Passive SI but able to contract for safety.  Patient has not harmed self or Others: Yes  New problem(s) identified: Pt currently not attending groups or discharge planning. Discharge Plan or Barriers: CSW assessing for appropriate referrals.  Additional comments: Daniel [name] is an 57 y.o. male who presents to [facility] Emergency Department requesting detox from alcohol and illegal drugs , per EPD notes. This Clinical research associate completed a tele psych on this patient. Pt says that he was dropped off at the ED by a family members. According to ED notes, pt states that he "needs help straightening up", because he has been drinking alcohol and using cocaine, marijuana and pills "hardcore" for the past 4 to 5 months. He admits to drinking one beer this morning. Writer attempted to assess patient in order to obtain additional information about his substance use, however; he was reluctant to discuss details. Pt was not very forthcoming. Patient was drowsy. Pt does feel depressed and admits to stressors. When asked to discuss his stressors patient responds, "It's everything and I can't really just says one thing". He has experienced SI intermittently within the past week. He admits to [facility] currently but no plan. He does not have a history of prior attempts. Patient denies having a outpatient provider. He is currently on Zoloft, Xanax, Haldol and Sinequan and denies any missed  doses. Pt unable to recall who is providing his medication management. He denies any other symptoms currently. Pt reports intermit ant auditory hallucinations of voices telling him to hurt other people. Pt denies current psychotic symptoms. Reason for Continuation of Hospitalization: Librium taper-withdrawals SI Medication management Mood stabilization Estimated length of stay: 3-4 days For review of initial/current patient goals, please see plan of care.  Attendees:  Patient:    Family:    Physician: Geoffery Lyons MD 11/15/2012 10:00 AM   Nursing: Philippa Chester RN 11/15/2012 10:00 AM   Clinical Social Worker [employer], LCSWA  11/15/2012 10:00 AM   Other: Thayer Ohm RN 11/15/2012 10:00 AM   Other: Aggie PA 11/15/2012 10:00 AM   Other: Massie Kluver, Community Care Coordinator  11/15/2012 10:01 AM   Other: Darden Dates Nurse CM 11/15/2012 10:01 AM   Scribe for Treatment Team:  Herbert Seta Smart LCSWA 11/15/2012 10:01 AM

## 2012-11-15 NOTE — Progress Notes (Signed)
Adult Psychoeducational Group Note  Date:  11/15/2012 Time:  8:40 PM  Group Topic/Focus:  Goals Group:   The focus of this group is to help patients establish daily goals to achieve during treatment and discuss how the patient can incorporate goal setting into their daily lives to aide in recovery.  Participation Level:  Did Not Attend  Participation Quality:  didnt attend  Affect:  didnt attend  Cognitive:  didnt attend  Insight: None  Engagement in Group:  didnt attend  Modes of Intervention:  didnt attend  Additional Comments:  Pt stayed in bed  Aldona Lento 11/15/2012, 8:40 PM

## 2012-11-15 NOTE — [facility] Group Notes (Signed)
[facility] LCSW Group Therapy  11/15/2012 2:00 PM  Type of Therapy:  Group Therapy  Participation Level:  Did Not Attend   Smart, Ragnar Waas 11/15/2012, 2:00 PM

## 2012-11-16 MED ORDER — LIDOCAINE 5 % EX PTCH
1.0000 | MEDICATED_PATCH | Freq: Once | CUTANEOUS | Status: AC
  Administered 2012-11-16: 1 via TRANSDERMAL
  Filled 2012-11-16 (×2): qty 1

## 2012-11-16 MED ORDER — LIDOCAINE 5 % EX PTCH
1.0000 | MEDICATED_PATCH | Freq: Every day | CUTANEOUS | Status: DC
  Administered 2012-11-17 – 2012-11-27 (×11): 1 via TRANSDERMAL
  Filled 2012-11-16 (×14): qty 1

## 2012-11-16 MED ORDER — GABAPENTIN 300 MG PO CAPS
300.0000 mg | ORAL_CAPSULE | Freq: Three times a day (TID) | ORAL | Status: DC
  Administered 2012-11-16 – 2012-11-19 (×8): 300 mg via ORAL
  Filled 2012-11-16 (×13): qty 1

## 2012-11-16 NOTE — [facility] Group Notes (Signed)
[facility] Group Notes: (Clinical Social Work)   11/16/2012      Type of Therapy:  Group Therapy   Participation Level:  Did Not Attend    Ambrose Mantle, LCSW 11/16/2012, 11:16 AM

## 2012-11-16 NOTE — Progress Notes (Signed)
[facility] MD Progress Note  11/16/2012 4:37 PM Daniel Gray  MRN:  [id number] Subjective:  Daniel Gray endorses that he is having a hard time with his back. He has chronic back pain and is exacerbated. He is committed to abstinence right now. Will like to go to a residential treatment center to continue to work on long term sobriety. He has been feeling down, weak and has been staying in bed most of the time. Diagnosis:  Cocaine Dependence, Psychotic Disorder NOS  ADL's:  Intact  Sleep: Fair  Appetite:  Fair  Suicidal Ideation:  Plan:  Denies Intent:  Denies Means:  Denies Homicidal Ideation:  Plan:  Denies Intent:  Denies Means:  Denies AEB (as evidenced by):  Psychiatric Specialty Exam: Review of Systems  HENT: Negative.   Eyes: Negative.   Respiratory: Negative.   Cardiovascular: Negative.   Gastrointestinal: Negative.   Genitourinary: Negative.   Musculoskeletal: Positive for back pain.  Skin: Negative.   Neurological: Positive for weakness.  Endo/Heme/Allergies: Negative.   Psychiatric/Behavioral: Positive for depression and substance abuse. The patient is nervous/anxious.     Blood pressure 130/76, pulse 71, temperature 97.6 F (36.4 C), temperature source Oral, resp. rate 17, height 6\' 1"  (1.854 m), weight 85 kg (187 lb 6.3 oz).Body mass index is 24.73 kg/(m^2).  General Appearance: Disheveled and in a wheel chair as his back is hurtin, leans forward  Eye Contact::  Minimal  Speech:  Clear and Coherent, Slow and not spontaneous  Volume:  Decreased  Mood:  Anxious, Depressed and worried  Affect:  Restricted  Thought Process:  Coherent and Goal Directed  Orientation:  Full (Time, Place, and Person)  Thought Content:  somatically focused, worried, concerns  Suicidal Thoughts:  No  Homicidal Thoughts:  No  Memory:  Immediate;   Fair Recent;   Fair Remote;   Fair  Judgement:  Fair  Insight:  Shallow  Psychomotor Activity:  Restlessness  Concentration:  Fair  Recall:   Fair  Akathisia:  No  Handed:  Right  AIMS (if indicated):     Assets:  Desire for Improvement  Sleep:  Number of Hours: 6.25   Current Medications: Current Facility-Administered Medications  Medication Dose Route Frequency Provider Last Rate Last Dose  . acetaminophen (TYLENOL) tablet 650 mg  650 mg Oral Q6H PRN Kerry Hough, PA-C      . alum & mag hydroxide-simeth (MAALOX/MYLANTA) 200-200-20 MG/5ML suspension 30 mL  30 mL Oral Q4H PRN Kerry Hough, PA-C      . chlordiazePOXIDE (LIBRIUM) capsule 25 mg  25 mg Oral Q6H PRN Kerry Hough, PA-C      . chlordiazePOXIDE (LIBRIUM) capsule 25 mg  25 mg Oral BH-qamhs Spencer E Simon, PA-C       Followed by  . [START ON 11/18/2012] chlordiazePOXIDE (LIBRIUM) capsule 25 mg  25 mg Oral Daily Kerry Hough, PA-C      . gabapentin (NEURONTIN) capsule 300 mg  300 mg Oral TID Rachael Fee, MD   300 mg at 11/16/12 1254  . hydrOXYzine (ATARAX/VISTARIL) tablet 25 mg  25 mg Oral Q6H PRN Kerry Hough, PA-C      . [START ON 11/17/2012] lidocaine (LIDODERM) 5 % 1 patch  1 patch Transdermal Daily Rachael Fee, MD      . lidocaine (LIDODERM) 5 % 1 patch  1 patch Transdermal Once Rachael Fee, MD   1 patch at 11/16/12 1254  . loperamide (IMODIUM) capsule 2-4 mg  2-4  mg Oral PRN Kerry Hough, PA-C      . magnesium hydroxide (MILK OF MAGNESIA) suspension 30 mL  30 mL Oral Daily PRN Kerry Hough, PA-C      . methocarbamol (ROBAXIN) tablet 500 mg  500 mg Oral TID Sanjuana Kava, NP   500 mg at 11/16/12 1154  . multivitamin with minerals tablet 1 tablet  1 tablet Oral Daily Kerry Hough, PA-C   1 tablet at 11/16/12 1154  . nicotine (NICODERM CQ - dosed in mg/24 hours) patch 21 mg  21 mg Transdermal Daily Rachael Fee, MD   21 mg at 11/16/12 1157  . ondansetron (ZOFRAN-ODT) disintegrating tablet 4 mg  4 mg Oral Q6H PRN Kerry Hough, PA-C      . thiamine (B-1) injection 100 mg  100 mg Intramuscular Once [employer], PA-C      . thiamine  (VITAMIN B-1) tablet 100 mg  100 mg Oral Daily Kerry Hough, PA-C   100 mg at 11/16/12 1154  . traZODone (DESYREL) tablet 50 mg  50 mg Oral QHS,MR X 1 Kerry Hough, PA-C   50 mg at 11/14/12 2120    Lab Results: No results found for this or any previous visit (from the past 48 hour(s)).  Physical Findings: AIMS: Facial and Oral Movements Muscles of Facial Expression: None, normal Lips and Perioral Area: None, normal Jaw: None, normal Tongue: None, normal,Extremity Movements Upper (arms, wrists, hands, fingers): None, normal Lower (legs, knees, ankles, toes): None, normal, Trunk Movements Neck, shoulders, hips: None, normal, Overall Severity Severity of abnormal movements (highest score from questions above): None, normal Incapacitation due to abnormal movements: None, normal Patient's awareness of abnormal movements (rate only patient's report): No Awareness, Dental Status Current problems with teeth and/or dentures?: No Does patient usually wear dentures?: No  CIWA:  CIWA-Ar Total: 3 COWS:  COWS Total Score: 0  Treatment Plan Summary: Daily contact with patient to assess and evaluate symptoms and progress in treatment Medication management  Plan: Supportive approach/coping skills/relapse prevention           Will increase the Neurontin to 200 mg TID           Use a Lidoderm patch            Will continue to reassess the psychotic symptoms Medical Decision Making Problem Points:  Review of psycho-social stressors (1) Data Points:  Review of medication regiment & side effects (2)  I certify that inpatient services furnished can reasonably be expected to improve the patient's condition.   Tatianna Ibbotson A 11/16/2012, 4:37 PM

## 2012-11-16 NOTE — [facility] Group Notes (Signed)
[facility] Group Notes:  (Nursing/MHT/Case Management/Adjunct)  Date:  11/16/2012  Time:  1:23 PM  Type of Therapy:  Psychoeducational Skills  Participation Level:  Did Not Attend  Daniel Gray 11/16/2012, 1:23 PM

## 2012-11-16 NOTE — Progress Notes (Signed)
Patient ID: Daniel Gray, male   DOB: Oct 25, 1955, 57 y.o.   MRN: [id number] D: Patient lying in bed with eyes closed. Respirations even and non-labored. A: Staff will monitor on q 15 minute checks, follow treatment plan, and give meds as ordered. R: Appears to be sleeping

## 2012-11-16 NOTE — Progress Notes (Signed)
Patient ID: Daniel Gray, male   DOB: 08-May-1955, 57 y.o.   MRN: [id number]   D: Pt has been very flat and depressed on the unit today, patient has also been very guarded and has not interacted with his peers nor staff. Pt has been in the bed most of the day, and has not engaged in any activity. Pt did get out of bed at lunch time and used a wheel chair to get lunch and his medication. After lunch he got back in bed and continued with not engaging in treatment. Pt reported been negative SI/HI, no HA/VH noted. A: 15 min checks continued for patient safety. R: Pts safety maintained.

## 2012-11-16 NOTE — Progress Notes (Signed)
Psychoeducational Group Note  Date:  11/16/2012 Time:  2100  Group Topic/Focus:  wrap up group  Participation Level: Did Not Attend  Participation Quality:  Not Applicable  Affect:  Not Applicable  Cognitive:  Not Applicable  Insight:  Not Applicable  Engagement in Group: Not Applicable  Additional Comments:  Remained in bed during group.  Shelah Lewandowsky 11/16/2012, 11:52 PM

## 2012-11-16 NOTE — Progress Notes (Signed)
Psychoeducational Group Note  Date:  11/16/2012 Time:  0945 am  Group Topic/Focus:  Identifying Needs:   The focus of this group is to help patients identify their personal needs that have been historically problematic and identify healthy behaviors to address their needs.  Participation Level:  Did Not Attend  Daniel Gray 11/16/2012,2:57 PM

## 2012-11-17 MED ORDER — ENSURE COMPLETE PO LIQD
237.0000 mL | Freq: Two times a day (BID) | ORAL | Status: DC
  Administered 2012-11-18 – 2012-11-27 (×14): 237 mL via ORAL

## 2012-11-17 NOTE — Progress Notes (Signed)
[facility] MD Progress Note  11/17/2012 2:20 PM Daniel Gray  MRN:  [id number] Subjective:  Daniel Gray is having a hard time projecting himself from here. States he feels safe here and has not seen the images of the old man and the dog. He has been sleeping well and states that if he oversleeps is because he has not slept days in a row. He has had a lot of support from his sisters who want him to pursue rehab as they were worried about him. He states he did not realized how bad things were until he had a chance to be away. Not sure what needs to happen from here. Still shaky, not feeling steady enough on his feet to stand up and walk on his own. States that he knows that the use of High Dosages of benzodiazepines for all those years probably have something to do with the way he is feeling Diagnosis:  Cocaine Dependence, Psychosis NOS  ADL's:  Intact  Sleep: Fair  Appetite:  Fair  Suicidal Ideation:  Plan:  denies Intent:  denies Means:  denies Homicidal Ideation:  Plan:  denies Intent:  denies Means:  denies AEB (as evidenced by):  Psychiatric Specialty Exam: Review of Systems  HENT: Negative.   Eyes: Negative.   Respiratory: Negative.   Cardiovascular: Negative.   Gastrointestinal: Negative.   Genitourinary: Negative.   Musculoskeletal: Positive for back pain.  Skin: Negative.   Neurological: Positive for dizziness, tremors and weakness.  Endo/Heme/Allergies: Negative.   Psychiatric/Behavioral: Positive for substance abuse. The patient is nervous/anxious.     Blood pressure 111/69, pulse 71, temperature 97.6 F (36.4 C), temperature source Oral, resp. rate 18, height 6\' 1"  (1.854 m), weight 85 kg (187 lb 6.3 oz).Body mass index is 24.73 kg/(m^2).  General Appearance: Disheveled  Eye Solicitor::  Fair  Speech:  Slow and not spontaneous  Volume:  Decreased  Mood:  Anxious and worried  Affect:  anxious, worried  Thought Process:  Coherent and Goal Directed  Orientation:  Full (Time,  Place, and Person)  Thought Content:  worries, concerns  Suicidal Thoughts:  No  Homicidal Thoughts:  No  Memory:  Immediate;   Fair Recent;   Fair Remote;   Fair  Judgement:  Fair  Insight:  Shallow  Psychomotor Activity:  Restlessness  Concentration:  Fair  Recall:  Fair  Akathisia:  No  Handed:  Right  AIMS (if indicated):     Assets:  Desire for Improvement  Sleep:  Number of Hours: 5   Current Medications: Current Facility-Administered Medications  Medication Dose Route Frequency Provider Last Rate Last Dose  . acetaminophen (TYLENOL) tablet 650 mg  650 mg Oral Q6H PRN Kerry Hough, PA-C      . alum & mag hydroxide-simeth (MAALOX/MYLANTA) 200-200-20 MG/5ML suspension 30 mL  30 mL Oral Q4H PRN Kerry Hough, PA-C      . chlordiazePOXIDE (LIBRIUM) capsule 25 mg  25 mg Oral BH-qamhs Spencer E Simon, PA-C   25 mg at 11/17/12 0746   Followed by  . [START ON 11/18/2012] chlordiazePOXIDE (LIBRIUM) capsule 25 mg  25 mg Oral Daily Kerry Hough, PA-C      . gabapentin (NEURONTIN) capsule 300 mg  300 mg Oral TID Rachael Fee, MD   300 mg at 11/17/12 1138  . lidocaine (LIDODERM) 5 % 1 patch  1 patch Transdermal Daily Rachael Fee, MD   1 patch at 11/17/12 [phone]  . magnesium hydroxide (MILK OF MAGNESIA) suspension  30 mL  30 mL Oral Daily PRN Kerry Hough, PA-C      . methocarbamol (ROBAXIN) tablet 500 mg  500 mg Oral TID Sanjuana Kava, NP   500 mg at 11/17/12 1138  . multivitamin with minerals tablet 1 tablet  1 tablet Oral Daily Kerry Hough, PA-C   1 tablet at 11/17/12 0745  . nicotine (NICODERM CQ - dosed in mg/24 hours) patch 21 mg  21 mg Transdermal Daily Rachael Fee, MD   21 mg at 11/17/12 0746  . thiamine (B-1) injection 100 mg  100 mg Intramuscular Once [employer], PA-C      . thiamine (VITAMIN B-1) tablet 100 mg  100 mg Oral Daily Kerry Hough, PA-C   100 mg at 11/17/12 0746  . traZODone (DESYREL) tablet 50 mg  50 mg Oral QHS,MR X 1 Kerry Hough, PA-C    50 mg at 11/14/12 2120    Lab Results: No results found for this or any previous visit (from the past 48 hour(s)).  Physical Findings: AIMS: Facial and Oral Movements Muscles of Facial Expression: None, normal Lips and Perioral Area: None, normal Jaw: None, normal Tongue: None, normal,Extremity Movements Upper (arms, wrists, hands, fingers): None, normal Lower (legs, knees, ankles, toes): None, normal, Trunk Movements Neck, shoulders, hips: None, normal, Overall Severity Severity of abnormal movements (highest score from questions above): None, normal Incapacitation due to abnormal movements: None, normal Patient's awareness of abnormal movements (rate only patient's report): No Awareness, Dental Status Current problems with teeth and/or dentures?: No Does patient usually wear dentures?: No  CIWA:  CIWA-Ar Total: 0 COWS:  COWS Total Score: 0  Treatment Plan Summary: Daily contact with patient to assess and evaluate symptoms and progress in treatment Medication management  Plan: Supporitve approach/coping skills/relapse prevention           Complete the librium Detox protocol           Reassess his tremors, unsteadiness           Reassess and address the co morbidities  Medical Decision Making Problem Points:  Review of psycho-social stressors (1) Data Points:  Review of medication regiment & side effects (2)  I certify that inpatient services furnished can reasonably be expected to improve the patient's condition.   Daniel Gray A 11/17/2012, 2:20 PM
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
## 2012-11-17 NOTE — Progress Notes (Signed)
Patient ID: Daniel Gray, male   DOB: 29-Sep-1955, 57 y.o.   MRN: [id number]  D: Pt has been very flat and depressed on the unit, and has also been very isolative. Pt does not attend any groups and does not engage in any treatment. Pt only wakes up for medications, and is not interested in staffs feedback regarding attending groups and engaging in treatment. Pt reported being negative SI/HI, no AH/VH noted. A: 15 min checks continued for patient safety. R: Pts safety maintained.

## 2012-11-17 NOTE — [facility] Group Notes (Signed)
[facility] Group Notes: (Clinical Social Work)   11/17/2012      Type of Therapy:  Group Therapy   Participation Level:  Did Not Attend    Abhinav Mayorquin Grossman-Orr, LCSW 11/17/2012, 12:14 PM     

## 2012-11-17 NOTE — Progress Notes (Signed)
Adult Psychoeducational Group Note  Date:  11/17/2012 Time:  3:15PM  Group Topic/Focus:  Making Healthy Choices:   The focus of this group is to help patients identify negative/unhealthy choices they were using prior to admission and identify positive/healthier coping strategies to replace them upon discharge.  Participation Level:  Did Not Attend  Additional Comments:  Pt opted not to attend group.   Zacarias Pontes R 11/17/2012, 4:08 PM

## 2012-11-17 NOTE — Progress Notes (Signed)
Patient ID: Daniel Gray, male   DOB: 01/20/1956, 56 y.o.   MRN: [id number] Patient asleep; no s/s of distress noted at this time. Respirations regular and unlabored. 

## 2012-11-17 NOTE — [facility] Group Notes (Signed)
[facility] Group Notes:  (Nursing/MHT/Case Management/Adjunct)  Date:  11/17/2012  Time:  2:56 PM  Type of Therapy:  Nurse Education  Participation Level:  Did Not Attend  Participation Quality:    Affect:    Cognitive:    Insight:    Engagement in Group:    Modes of Intervention:    Summary of Progress/Problems:  Cresenciano Lick 11/17/2012, 2:56 PM

## 2012-11-17 NOTE — Progress Notes (Signed)
Patient did not attend the evening speaker AA meeting. Pt remained in bed during group.   

## 2012-11-17 NOTE — Progress Notes (Signed)
Patient ID: Daniel Gray, male   DOB: 06-03-1955, 56 y.o.   MRN: [id number] Patient asleep; no s/s of distress noted at this time. Respirations regular and unlabored.

## 2012-11-17 NOTE — [facility] Group Notes (Signed)
[facility] Group Notes:  (Nursing/MHT/Case Management/Adjunct)  Date:  11/17/2012  Time:  10:26 AM  Type of Therapy: Psychoeducational Skills   Participation Level: Did Not Attend      Daniel Gray 11/17/2012, 10:26 AM

## 2012-11-18 NOTE — [facility] Group Notes (Signed)
[facility] LCSW Aftercare Discharge Planning Group Note   11/18/2012 10:15 AM  Participation Quality:  Appropriate   Mood/Affect:  Appropriate  Depression Rating:  4  Anxiety Rating:  4  Thoughts of Suicide:  No Will you contract for safety?   No  Current AVH:  No  Plan for Discharge/Comments:  Pt reports that he currently lives in an apt. He sees a Diplomatic Services operational officer and therapist at [facility] and is interested in continuing with them. Pt may be interested in going to another institution[facility]. Pt reports that he gets transportation via [employer].   Transportation Means: [employer]  Supports: sister  Counselling psychologist, Research scientist (physical sciences)

## 2012-11-18 NOTE — Progress Notes (Signed)
Pt has been in bed most of the day.   He rated his depression and hopelessness a 3 and his anxiety 4 on his self-inventory.  He is looking to go long-term treatment from here.  He denies any S/H ideation or A/V/H.  No prn meds thus far and no new orders.

## 2012-11-18 NOTE — Progress Notes (Signed)
Adult Psychoeducational Group Note  Date:  11/18/2012 Time:  10:52 PM  Group Topic/Focus:  Goals Group:   The focus of this group is to help patients establish daily goals to achieve during treatment and discuss how the patient can incorporate goal setting into their daily lives to aide in recovery.  Participation Level:  Active  Participation Quality:  Appropriate  Affect:  Appropriate  Cognitive:  Appropriate  Insight: Appropriate  Engagement in Group:  Engaged  Modes of Intervention:  Discussion  Additional Comments:  Pt had a good day, he was happy he ate good and has a place that can get help.  Terie Purser R 11/18/2012, 10:52 PM

## 2012-11-18 NOTE — [facility] Group Notes (Signed)
[facility] LCSW Group Therapy  11/18/2012 4:23 PM  Type of Therapy:  Group Therapy  Participation Level:  Did Not Attend   SmartHerbert Seta 11/18/2012, 4:23 PM

## 2012-11-18 NOTE — Progress Notes (Signed)
Patient ID: Daniel Gray, male   DOB: Aug 28, 1955, 57 y.o.   MRN: [id number]  D: Pt denies SI/HI/AVH. Pt is pleasant and cooperative. Pt sleep most of the night and interaction minimal.  A: Pt was offered support and encouragement. Pt was given scheduled medications. Pt was encourage to attend groups. Q 15 minute checks were done for safety.   R:Pt attends groups and interacts well with peers and staff. Pt is taking medication. Pt has no complaints at this time.Pt receptive to treatment and safety maintained on unit.

## 2012-11-18 NOTE — Progress Notes (Signed)
[facility] MD Progress Note  11/18/2012 3:37 PM Daniel Gray  MRN:  [id number] Subjective:  Daniel Gray endorses that he still has back pain and he is unstedyo his feet. He is still using the wheel chair to move around. States he has not seen the images and feels his brain is getting better but does not think his body is Diagnosis:  Cocaine Dependence  ADL's:  Intact  Sleep: Fair  Appetite:  Fair  Suicidal Ideation:  Plan:  denies Intent:  denies Means:  denies Homicidal Ideation:  Plan:  denies Intent:  denies Means:  denies AEB (as evidenced by):  Psychiatric Specialty Exam: Review of Systems  HENT: Negative.   Eyes: Negative.   Respiratory: Negative.   Cardiovascular: Negative.   Gastrointestinal: Negative.   Genitourinary: Negative.   Musculoskeletal: Positive for back pain.       Leg pain  Skin: Negative.   Neurological: Positive for weakness.  Endo/Heme/Allergies: Negative.   Psychiatric/Behavioral: Positive for depression and substance abuse. The patient is nervous/anxious.     Blood pressure 124/79, pulse 67, temperature 96.9 F (36.1 C), temperature source Oral, resp. rate 20, height 6\' 1"  (1.854 m), weight 85 kg (187 lb 6.3 oz).Body mass index is 24.73 kg/(m^2).  General Appearance: Disheveled  Eye Solicitor::  Fair  Speech:  Clear and Coherent and Slow  Volume:  Decreased  Mood:  Anxious and worried  Affect:  Restricted  Thought Process:  Coherent and Goal Directed  Orientation:  Full (Time, Place, and Person)  Thought Content:  worries, concerns  Suicidal Thoughts:  No  Homicidal Thoughts:  No  Memory:  Immediate;   Fair Recent;   Fair Remote;   Fair  Judgement:  Fair  Insight:  Shallow  Psychomotor Activity:  Restlessness  Concentration:  Fair  Recall:  Fair  Akathisia:  No  Handed:  Right  AIMS (if indicated):     Assets:  Desire for Improvement  Sleep:  Number of Hours: 6   Current Medications: Current Facility-Administered Medications  Medication  Dose Route Frequency Provider Last Rate Last Dose  . acetaminophen (TYLENOL) tablet 650 mg  650 mg Oral Q6H PRN Kerry Hough, PA-C      . alum & mag hydroxide-simeth (MAALOX/MYLANTA) 200-200-20 MG/5ML suspension 30 mL  30 mL Oral Q4H PRN Kerry Hough, PA-C      . feeding supplement (ENSURE COMPLETE) liquid 237 mL  237 mL Oral BID BM Court Joy, PA-C   237 mL at 11/18/12 1017  . gabapentin (NEURONTIN) capsule 300 mg  300 mg Oral TID Rachael Fee, MD   300 mg at 11/18/12 0758  . lidocaine (LIDODERM) 5 % 1 patch  1 patch Transdermal Daily Rachael Fee, MD   1 patch at 11/18/12 0759  . magnesium hydroxide (MILK OF MAGNESIA) suspension 30 mL  30 mL Oral Daily PRN Kerry Hough, PA-C      . methocarbamol (ROBAXIN) tablet 500 mg  500 mg Oral TID Sanjuana Kava, NP   500 mg at 11/18/12 0758  . multivitamin with minerals tablet 1 tablet  1 tablet Oral Daily Kerry Hough, PA-C   1 tablet at 11/18/12 0758  . nicotine (NICODERM CQ - dosed in mg/24 hours) patch 21 mg  21 mg Transdermal Daily Rachael Fee, MD   21 mg at 11/18/12 0759  . thiamine (B-1) injection 100 mg  100 mg Intramuscular Once [employer], PA-C      . thiamine (  VITAMIN B-1) tablet 100 mg  100 mg Oral Daily Kerry Hough, PA-C   100 mg at 11/18/12 0758  . traZODone (DESYREL) tablet 50 mg  50 mg Oral QHS,MR X 1 Kerry Hough, PA-C   50 mg at 11/14/12 2120    Lab Results: No results found for this or any previous visit (from the past 48 hour(s)).  Physical Findings: AIMS: Facial and Oral Movements Muscles of Facial Expression: None, normal Lips and Perioral Area: None, normal Jaw: None, normal Tongue: None, normal,Extremity Movements Upper (arms, wrists, hands, fingers): None, normal Lower (legs, knees, ankles, toes): None, normal, Trunk Movements Neck, shoulders, hips: None, normal, Overall Severity Severity of abnormal movements (highest score from questions above): None, normal Incapacitation due to abnormal  movements: None, normal Patient's awareness of abnormal movements (rate only patient's report): No Awareness, Dental Status Current problems with teeth and/or dentures?: No Does patient usually wear dentures?: No  CIWA:  CIWA-Ar Total: 0 COWS:  COWS Total Score: 0  Treatment Plan Summary: Daily contact with patient to assess and evaluate symptoms and progress in treatment Medication management  Plan: supportive approach/coping skills/relapse prevention           Will ask PT to assess  Medical Decision Making Problem Points:  Review of psycho-social stressors (1) Data Points:  Review of medication regiment & side effects (2)  I certify that inpatient services furnished can reasonably be expected to improve the patient's condition.   Rosamary Boudreau A 11/18/2012, 3:37 PM

## 2012-11-18 NOTE — Progress Notes (Signed)
Adult Psychoeducational Group Note  Date:  11/18/2012 Time:  11:00AM Group Topic/Focus:  Building Self Esteem:   The Focus of this group is helping patients become aware of the effects of self-esteem on their lives, the things they and others do that enhance or undermine their self-esteem, seeing the relationship between their level of self-esteem and the choices they make and learning ways to enhance self-esteem.  Participation Level:  Did Not Attend   Additional Comments: Pt. Didn't attend group.   Bing Plume D 11/18/2012, 1:03 PM

## 2012-11-19 MED ORDER — GABAPENTIN 100 MG PO CAPS
200.0000 mg | ORAL_CAPSULE | Freq: Three times a day (TID) | ORAL | Status: DC
  Administered 2012-11-20 – 2012-11-23 (×10): 200 mg via ORAL
  Administered 2012-11-23: 100 mg via ORAL
  Administered 2012-11-23 – 2012-11-27 (×10): 200 mg via ORAL
  Filled 2012-11-19 (×27): qty 2

## 2012-11-19 NOTE — [facility] Group Notes (Signed)
[facility] LCSW Group Therapy  11/19/2012 2:12 PM  Type of Therapy:  Group Therapy  Participation Level:  Did Not Attend  Smart, Jami Bogdanski 11/19/2012, 2:12 PM

## 2012-11-19 NOTE — Evaluation (Addendum)
Physical Therapy Evaluation Patient Details Name: Daniel Gray MRN: [id number] DOB: [birth year] Today's Date: 11/19/2012 Time: 4098-1191 PT Time Calculation (min): 19 min  PT Assessment / Plan / Recommendation History of Present Illness  57 yo male admitted to [facility] with cocaine dependence, back pain, unsteady gait. Hx of polysubstance abuse, Sz, anxiety, TBI.   Clinical Impression  On eval, pt required Min assist for mobility-able to ambulate ~100 feet with RW. Demonstrates poor posture, decreased activity tolerance, impaired dynamic standing balance. Recommend ST rehab at SNF to improve mobility and safety. Will follow during stay at [facility]. Recommended staff supervision when ambulating with RW; otherwise pt may need to use wheelchair for safety.    PT Assessment  Patient needs continued PT services    Follow Up Recommendations  Supervision for mobility/OOB;SNF    Does the patient have the potential to tolerate intense rehabilitation      Barriers to Discharge        Equipment Recommendations  Rolling walker with 5" wheels    Recommendations for Other Services OT consult   Frequency Min 2X/week    Precautions / Restrictions Precautions Precautions: Fall Restrictions Weight Bearing Restrictions: No   Pertinent Vitals/Pain       Mobility  Bed Mobility Bed Mobility: Supine to Sit;Sit to Supine Supine to Sit: 6: Modified independent (Device/Increase time) Sit to Supine: 6: Modified independent (Device/Increase time) Transfers Transfers: Sit to Stand;Stand to Sit Sit to Stand: 4: Min guard;From bed Stand to Sit: 4: Min guard;To bed Ambulation/Gait Ambulation/Gait Assistance: 4: Min assist Ambulation Distance (Feet): 100 Feet Assistive device: Rolling walker Ambulation/Gait Assistance Details: assist to stabilize throughout ambulation. VCs safety, distance from RW. Fatigues fairly easily.  Gait Pattern: Step-through pattern;Decreased stride length;Shuffle;Trunk flexed    Exercises Other Exercises Other Exercises: Static standing against wall-posture training. Pt with significantly flexed posture with ambulation. 5-7 reps of scapular retraction, neck retraction, trunk extension with wall providing tactile feedback to encourage full upright posture.    PT Diagnosis: Difficulty walking;Generalized weakness;Acute pain  PT Problem List: Decreased balance;Decreased activity tolerance;Decreased mobility;Pain;Decreased knowledge of use of DME PT Treatment Interventions: DME instruction;Gait training;Functional mobility training;Therapeutic activities;Therapeutic exercise;Patient/family education     PT Goals(Current goals can be found in the care plan section) Acute Rehab PT Goals Patient Stated Goal: less pain. rehab.  PT Goal Formulation: With patient Time For Goal Achievement: 12/03/12 Potential to Achieve Goals: Fair  Visit Information  Last PT Received On: 11/19/12 Assistance Needed: +1 History of Present Illness: 57 yo male admitted to [facility] with cocaine dependence, back pain, unsteady gait. Hx of polysubstance abuse, Sz, anxiety, TBI.        Prior Functioning  Home Living Family/patient expects to be discharged to:: Private residence Living Arrangements: Alone Type of Home: Apartment Home Access: Level entry Home Layout: One level Home Equipment: None Prior Function Level of Independence: Independent Communication Communication: No difficulties    Cognition  Cognition Arousal/Alertness: Awake/alert Behavior During Therapy: WFL for tasks assessed/performed Overall Cognitive Status: Within Functional Limits for tasks assessed    Extremity/Trunk Assessment Upper Extremity Assessment Upper Extremity Assessment: Overall WFL for tasks assessed Lower Extremity Assessment Lower Extremity Assessment: Overall WFL for tasks assessed Cervical / Trunk Assessment Cervical / Trunk Assessment: Kyphotic   Balance Balance Balance Assessed: Yes Dynamic  Standing Balance Dynamic Standing - Balance Support: Bilateral upper extremity supported Dynamic Standing - Level of Assistance: 4: Min assist  End of Session PT - End of Session Equipment Utilized During Treatment: Gait belt  Activity Tolerance: Patient limited by fatigue;Patient limited by pain Patient left: in bed;with call bell/phone within reach Nurse Communication: Mobility status (+1 assist/supervision when ambulating with RW)  GP     Rebeca Alert, MPT Pager: [phone]

## 2012-11-19 NOTE — Progress Notes (Signed)
Recreation Therapy Notes  Date: 08.12.2014 Time: 2:30pm Location: [address] Dayroom  Group Topic: Animal Assisted Activities  Goal Area(s) Addresses:  Patient will interact appropriately with dog team.    Behavioral Response: Engaged, Appropriate  Intervention: Animal Assisted Therapy Dog Team.   Clinical Observations/Feedback: Dog Team: [employer]. Patient interacted appropriately with peer, dog team, LRT and MHT.   Marykay Lex Saxon Barich, LRT/CTRS  Jearl Klinefelter 11/19/2012 4:54 PM

## 2012-11-19 NOTE — Progress Notes (Signed)
[facility] MD Progress Note  11/19/2012 3:57 PM Rajan Burgard  MRN:  [id number] Subjective:  Daniel Gray is still having a hard time. States he is experiencing back pain. It helps if he leans forward. Otherwise states he is feeling so much better. States he feels clear headed. States he wants to go to the long term treatment program. He is committed to abstinence. He states he has gotten support from his family. Denies any further psychotic symptoms R/O substance induced Diagnosis:  Cocaine Dependence, R/O substance induced psychosis  ADL's:  Intact  Sleep: Fair  Appetite:  Fair  Suicidal Ideation:  Plan:  denies Intent:  denies Means:  denies Homicidal Ideation:  Plan:  denies Intent:  denies Means:  denies AEB (as evidenced by):  Psychiatric Specialty Exam: Review of Systems  Constitutional: Negative.   HENT: Negative.   Eyes: Negative.   Respiratory: Negative.   Cardiovascular: Negative.   Gastrointestinal: Negative.   Genitourinary: Negative.   Musculoskeletal: Positive for back pain.  Skin: Negative.   Neurological: Negative.   Endo/Heme/Allergies: Negative.   Psychiatric/Behavioral: Positive for substance abuse.    Blood pressure 117/70, pulse 85, temperature 97.6 F (36.4 C), temperature source Oral, resp. rate 20, height 6\' 1"  (1.854 m), weight 85 kg (187 lb 6.3 oz).Body mass index is 24.73 kg/(m^2).  General Appearance: Disheveled  Eye Solicitor::  Fair  Speech:  Clear and Coherent and Slow  Volume:  Decreased  Mood:  Anxious and worried, in pain  Affect:  worried  Thought Process:  Coherent and Goal Directed  Orientation:  Full (Time, Place, and Person)  Thought Content:  worries, concerns  Suicidal Thoughts:  No  Homicidal Thoughts:  No  Memory:  Immediate;   Fair Recent;   Fair Remote;   Fair  Judgement:  Fair  Insight:  Shallow  Psychomotor Activity:  Psychomotor Retardation  Concentration:  Fair  Recall:  Fair  Akathisia:  No  Handed:  Right  AIMS (if  indicated):     Assets:  Desire for Improvement  Sleep:  Number of Hours: 4.5   Current Medications: Current Facility-Administered Medications  Medication Dose Route Frequency Provider Last Rate Last Dose  . acetaminophen (TYLENOL) tablet 650 mg  650 mg Oral Q6H PRN Kerry Hough, PA-C      . alum & mag hydroxide-simeth (MAALOX/MYLANTA) 200-200-20 MG/5ML suspension 30 mL  30 mL Oral Q4H PRN Kerry Hough, PA-C      . feeding supplement (ENSURE COMPLETE) liquid 237 mL  237 mL Oral BID BM Court Joy, PA-C   237 mL at 11/19/12 1407  . gabapentin (NEURONTIN) capsule 200 mg  200 mg Oral TID Rachael Fee, MD      . lidocaine (LIDODERM) 5 % 1 patch  1 patch Transdermal Daily Rachael Fee, MD   1 patch at 11/19/12 0802  . magnesium hydroxide (MILK OF MAGNESIA) suspension 30 mL  30 mL Oral Daily PRN Kerry Hough, PA-C      . multivitamin with minerals tablet 1 tablet  1 tablet Oral Daily Kerry Hough, PA-C   1 tablet at 11/19/12 0802  . nicotine (NICODERM CQ - dosed in mg/24 hours) patch 21 mg  21 mg Transdermal Daily Rachael Fee, MD   21 mg at 11/19/12 0802  . thiamine (B-1) injection 100 mg  100 mg Intramuscular Once [employer], PA-C      . thiamine (VITAMIN B-1) tablet 100 mg  100 mg Oral Daily Karleen Hampshire  E Simon, PA-C   100 mg at 11/19/12 0802  . traZODone (DESYREL) tablet 50 mg  50 mg Oral QHS,MR X 1 Kerry Hough, PA-C   50 mg at 11/14/12 2120    Lab Results: No results found for this or any previous visit (from the past 48 hour(s)).  Physical Findings: AIMS: Facial and Oral Movements Muscles of Facial Expression: None, normal Lips and Perioral Area: None, normal Jaw: None, normal Tongue: None, normal,Extremity Movements Upper (arms, wrists, hands, fingers): None, normal Lower (legs, knees, ankles, toes): None, normal, Trunk Movements Neck, shoulders, hips: None, normal, Overall Severity Severity of abnormal movements (highest score from questions above): None,  normal Incapacitation due to abnormal movements: None, normal Patient's awareness of abnormal movements (rate only patient's report): No Awareness, Dental Status Current problems with teeth and/or dentures?: No Does patient usually wear dentures?: No  CIWA:  CIWA-Ar Total: 0 COWS:  COWS Total Score: 0  Treatment Plan Summary: Daily contact with patient to assess and evaluate symptoms and progress in treatment Medication management  Plan: Supportive approach/copng skills/relapse prevention           Further evaluate the co morbidities           Note: he becomes sedated once he takes his medications. Will hold the muscle relaxer and reassess  Medical Decision Making Problem Points:  Review of psycho-social stressors (1) Data Points:  Review of medication regiment & side effects (2)  I certify that inpatient services furnished can reasonably be expected to improve the patient's condition.   Dillon Livermore A 11/19/2012, 3:57 PM

## 2012-11-19 NOTE — Progress Notes (Signed)
Recreation Therapy Notes  Date: 08.12.2014 Time: 3:00pm Location: [address] Dayroom   Group Topic: Problem Solving, Decision Making, Communication  Goal Area(s) Addresses:  Patient will effectively work together to accomplish a shared goal. Patient will verbalize skills needed to make activity successful.  Patient will verbalize ways to use skills used in group session post d/c.  Behavioral Response: Did not attend  Jearl Klinefelter, LRT/CTRS   Jearl Klinefelter 11/19/2012 4:04 PM

## 2012-11-19 NOTE — Progress Notes (Signed)
D: Patient denies SI/HI and A/V hallucinations; patient reports sleep is fair; reports appetite is good ; reports energy level is normal ; reports ability to pay attention is good; rates depression as 5/10; rates hopelessness 4/10; rates anxiety as 4/10;  patient denies any withdrawal symptoms but complains of fatigue  A: Monitored q 15 minutes; patient encouraged to attend groups; patient educated about medications; patient given medications per physician orders; patient encouraged to express feelings and/or concerns  R: Patient is tangential and disorganized; patient has been in the bed most of the day; patient's interaction with staff and peers is appropriate but minimal;  patient is taking medications as prescribed and tolerating medications; patient is not attending groups

## 2012-11-19 NOTE — Progress Notes (Signed)
Patient did not attend 0900 hr RN group. 

## 2012-11-19 NOTE — Progress Notes (Signed)
Patient ID: Daniel Gray, male   DOB: 10-06-1955, 57 y.o.   MRN: [id number]  Patient sitting in his room eating a snack; no distress noted at this time.

## 2012-11-19 NOTE — Clinical Social Work Note (Signed)
Referral faxed to [facility] for review per pt request.

## 2012-11-20 NOTE — Progress Notes (Signed)
Psychoeducational Group Note  Date:  11/20/2012 Time:  2000  Group Topic/Focus:  NA group  Participation Level: Did Not Attend  Participation Quality:  Not Applicable  Affect:  Not Applicable  Cognitive:  Not Applicable  Insight:  Not Applicable  Engagement in Group: Not Applicable  Additional Comments:  Pt stayed in the medication room playing cards during group time.  Kaleen Odea R 11/20/2012, 8:53 PM

## 2012-11-20 NOTE — Progress Notes (Signed)
Adult Psychoeducational Group Note  Date:  11/20/2012 Time:  10:00am Group Topic/Focus:  Therapeutic Activity  Participation Level:  Did Not Attend  Participation Quality:   Affect:    Cognitive:    Insight:   Engagement in Group:   Modes of Intervention:    Additional Comments:  Pt did not attend group.  Shelly Bombard D 11/20/2012, 10:54 AM

## 2012-11-20 NOTE — Progress Notes (Signed)
Patient ID: Daniel Gray, male   DOB: May 13, 1955, 57 y.o.   MRN: [id number] [facility] MD Progress Note  11/20/2012 4:19 PM Daniel Gray  MRN:  [id number]  Subjective:  Daniel Gray is still having a hard time. He continues to present with back pain complaints. Has been seen and evaluated by PT. See PT notes and recommendations. He continues to use wheel chair to aid his mobility within the unit. Is requesting a referral to [facility] in [place] to continue treatment after discharge from here. He says his mood feels better over all.  Diagnosis:  Cocaine Dependence, R/O substance induced psychosis  ADL's:  Intact  Sleep: Fair  Appetite:  Fair  Suicidal Ideation:  Plan:  denies Intent:  denies Means:  denies Homicidal Ideation:  Plan:  denies Intent:  denies Means:  denies AEB (as evidenced by):  Psychiatric Specialty Exam: Review of Systems  Constitutional: Negative.   HENT: Negative.   Eyes: Negative.   Respiratory: Negative.   Cardiovascular: Negative.   Gastrointestinal: Negative.   Genitourinary: Negative.   Musculoskeletal: Positive for back pain.  Skin: Negative.   Neurological: Negative.   Endo/Heme/Allergies: Negative.   Psychiatric/Behavioral: Positive for substance abuse.    Blood pressure 117/69, pulse 98, temperature 97.7 F (36.5 C), temperature source Oral, resp. rate 16, height 6\' 1"  (1.854 m), weight 85 kg (187 lb 6.3 oz).Body mass index is 24.73 kg/(m^2).  General Appearance: Disheveled  Eye Solicitor::  Fair  Speech:  Clear and Coherent and Slow  Volume:  Decreased  Mood:  Anxious and worried, in pain  Affect:  worried  Thought Process:  Coherent and Goal Directed  Orientation:  Full (Time, Place, and Person)  Thought Content:  worries, concerns  Suicidal Thoughts:  No  Homicidal Thoughts:  No  Memory:  Immediate;   Fair Recent;   Fair Remote;   Fair  Judgement:  Fair  Insight:  Shallow  Psychomotor Activity:  Psychomotor  Retardation  Concentration:  Fair  Recall:  Fair  Akathisia:  No  Handed:  Right  AIMS (if indicated):     Assets:  Desire for Improvement  Sleep:  Number of Hours: 4.5   Current Medications: Current Facility-Administered Medications  Medication Dose Route Frequency Provider Last Rate Last Dose  . acetaminophen (TYLENOL) tablet 650 mg  650 mg Oral Q6H PRN Kerry Hough, PA-C      . alum & mag hydroxide-simeth (MAALOX/MYLANTA) 200-200-20 MG/5ML suspension 30 mL  30 mL Oral Q4H PRN Kerry Hough, PA-C      . feeding supplement (ENSURE COMPLETE) liquid 237 mL  237 mL Oral BID BM Court Joy, PA-C   237 mL at 11/20/12 1549  . gabapentin (NEURONTIN) capsule 200 mg  200 mg Oral TID Rachael Fee, MD   200 mg at 11/20/12 1217  . lidocaine (LIDODERM) 5 % 1 patch  1 patch Transdermal Daily Rachael Fee, MD   1 patch at 11/20/12 [phone]  . magnesium hydroxide (MILK OF MAGNESIA) suspension 30 mL  30 mL Oral Daily PRN Kerry Hough, PA-C      . multivitamin with minerals tablet 1 tablet  1 tablet Oral Daily Kerry Hough, PA-C   1 tablet at 11/20/12 0810  . nicotine (NICODERM CQ - dosed in mg/24 hours) patch 21 mg  21 mg Transdermal Daily Rachael Fee, MD   21 mg at 11/20/12 0810  . thiamine (B-1) injection 100 mg  100 mg Intramuscular Once [employer]  E Simon, PA-C      . thiamine (VITAMIN B-1) tablet 100 mg  100 mg Oral Daily Kerry Hough, PA-C   100 mg at 11/20/12 0810  . traZODone (DESYREL) tablet 50 mg  50 mg Oral QHS,MR X 1 Kerry Hough, PA-C   50 mg at 11/19/12 2157    Lab Results: No results found for this or any previous visit (from the past 48 hour(s)).  Physical Findings: AIMS: Facial and Oral Movements Muscles of Facial Expression: None, normal Lips and Perioral Area: None, normal Jaw: None, normal Tongue: None, normal,Extremity Movements Upper (arms, wrists, hands, fingers): None, normal Lower (legs, knees, ankles, toes): None, normal, Trunk Movements Neck, shoulders,  hips: None, normal, Overall Severity Severity of abnormal movements (highest score from questions above): None, normal Incapacitation due to abnormal movements: None, normal Patient's awareness of abnormal movements (rate only patient's report): No Awareness, Dental Status Current problems with teeth and/or dentures?: No Does patient usually wear dentures?: No  CIWA:  CIWA-Ar Total: 0 COWS:  COWS Total Score: 0  Treatment Plan Summary: Daily contact with patient to assess and evaluate symptoms and progress in treatment Medication management  Plan: Supportive approach/coping skills/relapse prevention. Encouraged out of room, participation in group sessions and application of coping skills when distressed. Will continue to monitor response to/adverse effects of medications in use to assure effectiveness. Continue to monitor mood, behavior and interaction with staff and other patients. [facility] plans in progress ([facility] [place]) Continue current plan of care.  Medical Decision Making Problem Points:  Review of psycho-social stressors (1) Data Points:  Review of medication regiment & side effects (2)  I certify that inpatient services furnished can reasonably be expected to improve the patient's condition.   Armandina Stammer I, PMHNP-BC 11/20/2012, 4:19 PM

## 2012-11-20 NOTE — B[facility] Group Notes (Signed)
Adult Psychoeducational Group Note  Date:  11/20/2012 Time:  4:04 AM  Group Topic/Focus:                Wrap-Up Group:   The focus of this group is to help patients review their daily goal of treatment and discuss progress on daily workbooks.  Participation Level:  Minimal  Participation Quality:  Appropriate  Affect:  Appropriate  Cognitive:  Alert  Insight: None  Engagement in Group:  None  Modes of Intervention:  Education  Additional Comments:  Client was present for group but didn't participate in group discussions with the representatives from NA/AA.  Delia Chimes 11/20/2012, 4:04 AM

## 2012-11-20 NOTE — B[facility] Suicide Risk Assessment (Signed)
[facility] INPATIENT:  Family/Significant Other Suicide Prevention Education  Suicide Prevention Education:  Patient Refusal for Family/Significant Other Suicide Prevention Education: The patient Daniel Gray has refused to provide written consent for family/significant other to be provided Family/Significant Other Suicide Prevention Education during admission and/or prior to discharge.  Physician notified.  SPE completed with Daniel Gray and Daniel Gray was given SPI pamphlet. He was encouraged to ask questions and talk about concerns relating to SPE.   Daniel Gray, Daniel Gray 11/20/2012, 3:06 PM

## 2012-11-20 NOTE — Progress Notes (Signed)
Adult Psychoeducational Group Note  Date:  11/20/2012 Time:  3:09 PM  Group Topic/Focus:  Personal Choices and Values:   The focus of this group is to help patients assess and explore the importance of values in their lives, how their values affect their decisions, how they express their values and what opposes their expression.  Participation Level:  Did Not Attend  Additional Comments:  Pt did not attend group, despite being prompted to do so by staff.  Reinaldo Raddle K 11/20/2012, 3:09 PM

## 2012-11-20 NOTE — Progress Notes (Signed)
D:Pt went to his room around 9:00 this morning and has been lying in his bed. Pt rates feelings of hopelessness and  depression as a 5 on 1-10 scale with 10 being the most. Pt talked about going to a rehab and needing to be around other people that do not abuse drugs or alcohol. Pt reports feeling weak when standing and having leg tremors.  A:Supported pt to discuss feelings. Offered encouragement and 15 minute checks. Reinforced fall risk safety. R:Pt denies si and hi. Pt thanked Clinical research associate for talking to him. Safety maintained on the unit.

## 2012-11-20 NOTE — Tx Team (Signed)
Interdisciplinary Treatment Plan Update (Adult)  Date: 11/20/2012   Time Reviewed: 10:11 AM  Progress in Treatment:  Attending groups: Intermittently Participating in groups: Minimally Taking medication as prescribed: Yes  Tolerating medication: Yes  Family/Significant othe contact made: Not yet. SPE required for this pt.  Patient understands diagnosis: Yes, AEB seeking treatment for SI, substance abuse, and ETOH detox.  Discussing patient identified problems/goals with staff: Yes  Medical problems stabilized or resolved: Yes  Denies suicidal/homicidal ideation: Yes. Self report Patient has not harmed self or Others: Yes  New problem(s) identified: Pt had PT consult yesterday evening. Pt must use wheelchair due to imbalance/weakness and to ensure pt safety.  Discharge Plan or Barriers: CSW assessing for appropriate referrals. [facility]erral sent to [facility] in [place], [place]. Pt interested in treatment 1-3 months but is aware that he must be able to walk safetly in order to be admitted into facilities such as these.  Additional comments: n/a  Reason for Continuation of Hospitalization: Medication management Mood stabilization Estimated length of stay: 1-2 days  For review of initial/current patient goals, please see plan of care.  Attendees:  Patient:    Family:    Physician:    Nursing: Brittney RN 11/20/2012 10:11 AM   Clinical Social Worker [employer], LCSWA  11/20/2012 10:11 AM   Other: Lupita Leash RN  11/20/2012 10:11 AM   Other: Aggie PA 11/20/2012 10:11 AM   Other: Massie Kluver, Community Care Coordinator  11/20/2012 10:11 AM   Other:     Scribe for Treatment Team:  [employer] LCSWA 11/20/2012 10:11 AM

## 2012-11-20 NOTE — Progress Notes (Addendum)
Patient ID: Daniel Gray, male   DOB: 04-14-1955, 57 y.o.   MRN: [id number]  D: Pt informed the writer that he wants to be considered for a "3 month program".  Pt stood to show the Clinical research associate that he can "still stand". But stated that after 10 to 15 mins he starts to tremor. Pt reported that he was able to attend "one group in the morning and one group in the evening.  A:  Support and encouragement was offered.  15 min checks continued for safety.  R: Pt remains safe.

## 2012-11-20 NOTE — Progress Notes (Signed)
Patient ID: Daniel Gray, male   DOB: Apr 13, 1955, 57 y.o.   MRN: [id number]   D: Pt asked writer if she knew where he would be attending rehab. Writer explained that it was located in [place]. Pt stated the environment looked "really nice. Like a place in [place]".  Pt verbalized and demonstrated that he was getting stronger, by standing up for several minutes without support.   A:  Support and encouragement was offered. 15 min checks continued for safety.  R: Pt remains safe.

## 2012-11-20 NOTE — B[facility] Group Notes (Signed)
[facility] LCSW Aftercare Discharge Planning Group Note   11/20/2012 9:43 AM  Participation Quality:  DID NOT ATTEND  Smart, Daniel Gray

## 2012-11-20 NOTE — B[facility] Group Notes (Signed)
[facility] LCSW Group Therapy  11/20/2012 2:49 PM  Type of Therapy:  Group Therapy  Participation Level:  Did Not Attend  Smart, Herbert Seta 11/20/2012, 2:49 PM

## 2012-11-21 MED ORDER — FLUOXETINE HCL 20 MG PO CAPS
20.0000 mg | ORAL_CAPSULE | Freq: Every day | ORAL | Status: DC
  Administered 2012-11-21 – 2012-11-27 (×7): 20 mg via ORAL
  Filled 2012-11-21 (×9): qty 1

## 2012-11-21 NOTE — Progress Notes (Addendum)
Recreation Therapy Notes  Date: 08.14.2014 Time: 3:10pm Location: [address] Dayroom  Group Topic: Communication, Team Building, Problem Solving  Goal Area(s) Addresses:  Patient will be able to recognize use of communication, team building and problem solving during course of group activity. Patient will verbalize need for communication, team building and problem solving to make group activity successful.  Patient will verbalize benefit of communication, team building and problem solving to post d/c goals.   Behavioral Response: Did not attend.  Marykay Lex Nichoel Digiulio, LRT/CTRS  Jearl Klinefelter 11/21/2012 6:54 PM

## 2012-11-21 NOTE — Progress Notes (Signed)
Adult Psychoeducational Group Note  Date:  11/21/2012 Time:  12:51 PM  Group Topic/Focus:  Rediscovering Joy:   The focus of this group is to explore various ways to relieve stress in a positive manner.  Participation Level:  Did Not Attend   Cathlean Cower 11/21/2012, 12:51 PM

## 2012-11-21 NOTE — Progress Notes (Signed)
Patient ID: Daniel Gray, male   DOB: November 01, 1955, 57 y.o.   MRN: [id number] Patient ID: Daniel Gray, male   DOB: 1956-01-30, 57 y.o.   MRN: [id number] [facility] MD Progress Note  11/21/2012 3:21 PM Daniel Gray  MRN:  [id number]  Subjective:  Daniel Gray is still having a hard time. He continues to present with pain complaints which he says is all over. He says that he is feeling the blaah today. He continues to use wheel chair to aid his mobility within the unit. He rated his depression at #7.  Diagnosis:  Cocaine Dependence, R/O substance induced psychosis  ADL's:  Intact  Sleep: Fair  Appetite:  Fair  Suicidal Ideation:  Plan:  denies Intent:  denies Means:  denies Homicidal Ideation:  Plan:  denies Intent:  denies Means:  denies  AEB (as evidenced by):  Psychiatric Specialty Exam: Review of Systems  Constitutional: Negative.   HENT: Negative.   Eyes: Negative.   Respiratory: Negative.   Cardiovascular: Negative.   Gastrointestinal: Negative.   Genitourinary: Negative.   Musculoskeletal: Positive for back pain.  Skin: Negative.   Neurological: Negative.   Endo/Heme/Allergies: Negative.   Psychiatric/Behavioral: Positive for substance abuse.    Blood pressure 120/66, pulse 101, temperature 97.4 F (36.3 C), temperature source Oral, resp. rate 18, height 6\' 1"  (1.854 m), weight 85 kg (187 lb 6.3 oz).Body mass index is 24.73 kg/(m^2).  General Appearance: Disheveled  Eye Solicitor::  Fair  Speech:  Clear and Coherent and Slow  Volume:  Decreased  Mood:  Anxious and worried, in pain, rated depression at #7  Affect:  worried  Thought Process:  Coherent and Goal Directed  Orientation:  Full (Time, Place, and Person)  Thought Content:  worries, concerns  Suicidal Thoughts:  No  Homicidal Thoughts:  No  Memory:  Immediate;   Fair Recent;   Fair Remote;   Fair  Judgement:  Fair  Insight:  Shallow  Psychomotor Activity:  Psychomotor Retardation  Concentration:  Fair   Recall:  Fair  Akathisia:  No  Handed:  Right  AIMS (if indicated):     Assets:  Desire for Improvement  Sleep:  Number of Hours: 5.25   Current Medications: Current Facility-Administered Medications  Medication Dose Route Frequency Provider Last Rate Last Dose  . acetaminophen (TYLENOL) tablet 650 mg  650 mg Oral Q6H PRN Kerry Hough, PA-C      . alum & mag hydroxide-simeth (MAALOX/MYLANTA) 200-200-20 MG/5ML suspension 30 mL  30 mL Oral Q4H PRN Kerry Hough, PA-C      . feeding supplement (ENSURE COMPLETE) liquid 237 mL  237 mL Oral BID BM Court Joy, PA-C   237 mL at 11/20/12 1549  . FLUoxetine (PROZAC) capsule 20 mg  20 mg Oral Daily Sanjuana Kava, NP      . gabapentin (NEURONTIN) capsule 200 mg  200 mg Oral TID Rachael Fee, MD   200 mg at 11/21/12 1141  . lidocaine (LIDODERM) 5 % 1 patch  1 patch Transdermal Daily Rachael Fee, MD   1 patch at 11/21/12 [phone]  . magnesium hydroxide (MILK OF MAGNESIA) suspension 30 mL  30 mL Oral Daily PRN Kerry Hough, PA-C   30 mL at 11/21/12 0745  . multivitamin with minerals tablet 1 tablet  1 tablet Oral Daily Kerry Hough, PA-C   1 tablet at 11/21/12 0741  . nicotine (NICODERM CQ - dosed in mg/24 hours) patch 21 mg  21 mg Transdermal  Daily Rachael Fee, MD   21 mg at 11/21/12 0745  . thiamine (B-1) injection 100 mg  100 mg Intramuscular Once [employer], PA-C      . thiamine (VITAMIN B-1) tablet 100 mg  100 mg Oral Daily Kerry Hough, PA-C   100 mg at 11/21/12 0741  . traZODone (DESYREL) tablet 50 mg  50 mg Oral QHS,MR X 1 Spencer E Simon, PA-C   50 mg at 11/21/12 4098    Lab Results: No results found for this or any previous visit (from the past 48 hour(s)).  Physical Findings: AIMS: Facial and Oral Movements Muscles of Facial Expression: None, normal Lips and Perioral Area: None, normal Jaw: None, normal Tongue: None, normal,Extremity Movements Upper (arms, wrists, hands, fingers): None, normal Lower (legs,  knees, ankles, toes): None, normal, Trunk Movements Neck, shoulders, hips: None, normal, Overall Severity Severity of abnormal movements (highest score from questions above): None, normal Incapacitation due to abnormal movements: None, normal Patient's awareness of abnormal movements (rate only patient's report): No Awareness, Dental Status Current problems with teeth and/or dentures?: No Does patient usually wear dentures?: No  CIWA:  CIWA-Ar Total: 0 COWS:  COWS Total Score: 0  Treatment Plan Summary: Daily contact with patient to assess and evaluate symptoms and progress in treatment Medication management  Plan: Supportive approach/coping skills/relapse prevention. Initiate Prozac 20 mg daily for depression. Encouraged out of room, participation in group sessions and application of coping skills when distressed. Will continue to monitor response to/adverse effects of medications in use to assure effectiveness. Continue to monitor mood, behavior and interaction with staff and other patients. [facility]charge plans in progress ([facility] [place]) Continue current plan of care.  Medical Decision Making Problem Points:  Review of psycho-social stressors (1) Data Points:  Review of medication regiment & side effects (2)  I certify that inpatient services furnished can reasonably be expected to improve the patient's condition.   Armandina Stammer I, PMHNP-BC 11/21/2012, 3:21 PM

## 2012-11-21 NOTE — B[facility] Group Notes (Signed)
[facility] LCSW Group Therapy  11/21/2012 2:28 PM  Type of Therapy:  Group Therapy  Participation Level:  Did Not Attend  Smart, Matas Burrows 11/21/2012, 2:28 PM

## 2012-11-21 NOTE — Progress Notes (Signed)
D: Patient denies SI/HI auditory and visual hallucinations. The patient has a depressed mood and affect. The patient is reporting less detox symptoms today and states that he is feeling "a little better." The patient is intermittently using a wheelchair for ambulation and reports that he is feeling "less weak today." The patient is attending groups and interacting appropriately within the milieu.  A: Patient given emotional support from RN. Patient encouraged to come to staff with concerns and/or questions. Patient's medication routine continued. Patient's orders and plan of care reviewed.  R: Patient remains cooperative. Will continue to monitor patient q15 minutes for safety.

## 2012-11-21 NOTE — Progress Notes (Signed)
Recreation Therapy Notes  Date: 08.14.2014 Time: 2:30pm Location: [address] Dayroom   Group Topic: Animal Assisted Activities  Goal Area(s) Addresses:  Patient will interact appropriately with dog team.    Behavioral Response: Did not attend.   Marykay Lex Rishawn Walck, LRT/CTRS  Jearl Klinefelter 11/21/2012 7:18 PM

## 2012-11-22 NOTE — Care Management Utilization Note (Signed)
PER STATE REGULATIONS 482.30  THIS CHART WAS REVIEWED FOR MEDICAL NECESSITY WITH RESPECT TO THE PATIENT'S ADMISSION/ DURATION OF STAY.  NEXT REVIEW DATE: 11/25/12 

## 2012-11-22 NOTE — Clinical Social Work Note (Signed)
Pt has phone interview at 12PM on Monday with [facility]. He will not be able to have lidoderm patches at this facility. CSW to talk with patient about this restriction.

## 2012-11-22 NOTE — B[facility] Group Notes (Signed)
[facility] LCSW Aftercare Discharge Planning Group Note   11/22/2012 9:07 AM  Participation Quality:  DID NOT ATTEND   Smart, Daniel Gray

## 2012-11-22 NOTE — B[facility] Group Notes (Signed)
[facility] LCSW Group Therapy  11/22/2012 1:49 PM  Type of Therapy:  Group Therapy  Participation Level:  Did Not Attend   Smart, Herbert Seta 11/22/2012, 1:49 PM

## 2012-11-22 NOTE — Tx Team (Signed)
Interdisciplinary Treatment Plan Update (Adult)  Date: 11/22/2012   Time Reviewed: 9:45 AM  Progress in Treatment:  Attending groups:No. Participating in groups: No. Taking medication as prescribed: Yes  Tolerating medication: Yes  Family/Significant othe contact made: Pt refused to consent to family contact. SPE completed with pt.  Patient understands diagnosis: Yes, AEB seeking treatment for SI, substance abuse, and ETOH detox.  Discussing patient identified problems/goals with staff: Yes  Medical problems stabilized or resolved: Yes  Denies suicidal/homicidal ideation: Yes. Self report Patient has not harmed self or Others: Yes  New problem(s) identified: Pt must use wheelchair due to imbalance/weakness and to ensure pt safety.  Discharge Plan or Barriers: CSW assessing for appropriate referrals. Referral sent to [facility] in [place], [place]. Pt interested in treatment 1-3 months but is aware that he must be able to walk safetly in order to be admitted into facilities such as these.  Additional comments: n/a  Reason for Continuation of Hospitalization: Medication management Mood stabilization Estimated length of stay: 2-3 days  For review of initial/current patient goals, please see plan of care.  Attendees:  Patient:    Family:    Physician:    Nursing: Brittney RN 11/22/2012 9:45 AM   Clinical Social Worker Velena Keegan Smart, LCSWA  11/22/2012 9:45 AM   Other: Kendell Bane RN  11/22/2012 9:45 AM   Other: Aggie PA 11/22/2012 9:45 AM   Other:    Other:     Scribe for Treatment Team:  [employer] LCSWA 11/22/2012 9:45 AM

## 2012-11-22 NOTE — Progress Notes (Signed)
D: Patient denies SI/HI and auditory and visual hallucinations. The patient has a depressed mood and affect. The patient isolates to his room throughout the day. The patient is using a wheelchair for ambulation and states that he feels "weak and bad." The patient complains of no detox symptoms but states that he just "feels bad."  A: Patient given emotional support from RN. Patient encouraged to come to staff with concerns and/or questions. Patient's medication routine continued. Patient's orders and plan of care reviewed. NP notified of patient's mobility issues and his general weakness.  R: Patient remains cooperative. Will continue to monitor patient q15 minutes for safety.

## 2012-11-22 NOTE — Progress Notes (Signed)
D   Pt remained in his room most of the shift and did not attend karoke group  He is generally compliant with treatment   He did not request any medications for sleep   He has been urinating on his floor and has been instructed to sit down to urinate   He uses a wheelchair at times and has a weak unsteady gait   He is depressed and flat A   Verbal support given   Medications administered and effectiveness monitored  Q 15 min checks R   Pt is safe at present

## 2012-11-22 NOTE — Progress Notes (Signed)
Psychoeducational Group Note  Date:  11/22/2012 Time:  1000  Group Topic/Focus:  Psychoeducational Group  Participation Level: Did Not Attend  Participation Quality:  Not Applicable  Affect:  Not Applicable  Cognitive:  Not Applicable  Insight:  Not Applicable  Engagement in Group: Not Applicable  Additional Comments:  Patient asleep in bed.  Bretta Fees MCCOLLUM 11/22/2012, 11:01 AM

## 2012-11-22 NOTE — Progress Notes (Addendum)
Adult Psychoeducational Group Note  Date:  11/22/2012 Time:  1:07 PM  Group Topic/Focus:  Relapse Prevention Planning:   The focus of this group is to define relapse and discuss the need for planning to combat relapse.  Participation Level:  None  Participation Quality:    Affect:  Flat  Cognitive:  Lacking  Insight: Limited and None  Engagement in Group:  None  Modes of Intervention:  Discussion and Support  Additional Comments:  Pt did not join in the relapse discussion but did mention that AA is a good support for recovery.  Reynolds Bowl 11/22/2012, 1:07 PM

## 2012-11-22 NOTE — Progress Notes (Signed)
Physical Therapy Treatment Patient Details Name: Daniel Gray MRN: [id number] DOB: February 22, 1956 Today's Date: 11/22/2012 Time: 9147-8295 PT Time Calculation (min): 19 min  PT Assessment / Plan / Recommendation  History of Present Illness 57 yo male admitted to [facility] with cocaine dependence, back pain, unsteady gait. Hx of polysubstance abuse, Sz, anxiety, TBI.    PT Comments   Pt participatory with gait and standing exercises and balance. Pt remains very unsteady if not using UE support.  Follow Up Recommendations  Supervision for mobility/OOB;SNF     Does the patient have the potential to tolerate intense rehabilitation     Barriers to Discharge        Equipment Recommendations  Rolling walker with 5" wheels    Recommendations for Other Services    Frequency Min 2X/week   Progress towards PT Goals Progress towards PT goals: Progressing toward goals  Plan Current plan remains appropriate    Precautions / Restrictions Precautions Precautions: Fall       Mobility  Bed Mobility Supine to Sit: 7: Independent Sit to Supine: 7: Independent Transfers Sit to Stand: 6: Modified independent (Device/Increase time) Stand to Sit: 6: Modified independent (Device/Increase time) Details for Transfer Assistance: pt got up and got his clothes on and got up to RW without assistance. Ambulation/Gait Ambulation/Gait Assistance: 4: Min guard;4: Min Environmental consultant (Feet): 200 Feet Assistive device: Rolling walker Ambulation/Gait Assistance Details: ambulated x 50 ' with only 1 rail and min assist. Gait Pattern: Step-through pattern;Decreased stride length;Shuffle;Trunk flexed General Gait Details: L foot shuffkles more than R.    Exercises General Exercises - Lower Extremity Hip ABduction/ADduction: AROM;Both;Standing Hip Flexion/Marching: AROM;Both;15 reps;Standing Toe Raises: AROM;Both;Standing Other Exercises Other Exercises: braiding to L/R holding onto rail. sidestepping  to L/R with rail and min guard.   PT Diagnosis:    PT Problem List:   PT Treatment Interventions:     PT Goals (current goals can now be found in the care plan section)    Visit Information  Last PT Received On: 11/22/12 Assistance Needed: +1 History of Present Illness: 57 yo male admitted to [facility] with cocaine dependence, back pain, unsteady gait. Hx of polysubstance abuse, Sz, anxiety, TBI.     Subjective Data      Cognition  Cognition Arousal/Alertness: Awake/alert    Balance  Dynamic Standing Balance Dynamic Standing - Level of Assistance: 4: Min assist Dynamic Standing - Comments: Practiced unilateral stance, standing with no UE support.  End of Session PT - End of Session Activity Tolerance: Patient tolerated treatment well Patient left: in bed;with call bell/phone within reach Nurse Communication: Mobility status   GP     Rada Hay 11/22/2012, 2:22 PM Blanchard Kelch PT [phone]

## 2012-11-22 NOTE — Progress Notes (Signed)
Patient ID: Daniel Gray, male   DOB: Jan 13, 1956, 57 y.o.   MRN: [id number] [facility] MD Progress Note  11/22/2012 8:21 PM Daniel Gray  MRN:  [id number]  Subjective: Patient is seen, but is sleeping soundly and snoring loudly.  Chart is reviewed. Diagnosis:  Cocaine Dependence, R/O substance induced psychosis  ADL's:  Intact  Sleep: Fair  Appetite:  Fair  Suicidal Ideation:  Plan:  denies Intent:  denies Means:  denies Homicidal Ideation:  Plan:  denies Intent:  denies Means:  denies  AEB (as evidenced by):  Psychiatric Specialty Exam: Review of Systems  Constitutional: Negative.   HENT: Negative.   Eyes: Negative.   Respiratory: Negative.   Cardiovascular: Negative.   Gastrointestinal: Negative.   Genitourinary: Negative.   Musculoskeletal: Positive for back pain.  Skin: Negative.   Neurological: Negative.   Endo/Heme/Allergies: Negative.   Psychiatric/Behavioral: Positive for substance abuse.    Blood pressure 131/74, pulse 88, temperature 98 F (36.7 C), temperature source Oral, resp. rate 18, height 6\' 1"  (1.854 m), weight 85 kg (187 lb 6.3 oz).Body mass index is 24.73 kg/(m^2).  General Appearance: Disheveled  Eye Solicitor::  Fair  Speech:  Clear and Coherent and Slow  Volume:  Decreased  Mood:  Anxious and worried, in pain, rated depression at #7  Affect:  worried  Thought Process:  Coherent and Goal Directed  Orientation:  Full (Time, Place, and Person)  Thought Content:  worries, concerns  Suicidal Thoughts:  No  Homicidal Thoughts:  No  Memory:  Immediate;   Fair Recent;   Fair Remote;   Fair  Judgement:  Fair  Insight:  Shallow  Psychomotor Activity:  Psychomotor Retardation  Concentration:  Fair  Recall:  Fair  Akathisia:  No  Handed:  Right  AIMS (if indicated):     Assets:  Desire for Improvement  Sleep:  Number of Hours: 4   Current Medications: Current Facility-Administered Medications  Medication Dose Route Frequency Provider Last Rate  Last Dose  . acetaminophen (TYLENOL) tablet 650 mg  650 mg Oral Q6H PRN Kerry Hough, PA-C      . alum & mag hydroxide-simeth (MAALOX/MYLANTA) 200-200-20 MG/5ML suspension 30 mL  30 mL Oral Q4H PRN Kerry Hough, PA-C      . feeding supplement (ENSURE COMPLETE) liquid 237 mL  237 mL Oral BID BM Court Joy, PA-C   237 mL at 11/22/12 1155  . FLUoxetine (PROZAC) capsule 20 mg  20 mg Oral Daily Sanjuana Kava, NP   20 mg at 11/22/12 0742  . gabapentin (NEURONTIN) capsule 200 mg  200 mg Oral TID Rachael Fee, MD   200 mg at 11/22/12 1704  . lidocaine (LIDODERM) 5 % 1 patch  1 patch Transdermal Daily Rachael Fee, MD   1 patch at 11/22/12 [phone]  . magnesium hydroxide (MILK OF MAGNESIA) suspension 30 mL  30 mL Oral Daily PRN Kerry Hough, PA-C   30 mL at 11/21/12 1634  . multivitamin with minerals tablet 1 tablet  1 tablet Oral Daily Kerry Hough, PA-C   1 tablet at 11/22/12 5621  . nicotine (NICODERM CQ - dosed in mg/24 hours) patch 21 mg  21 mg Transdermal Daily Rachael Fee, MD   21 mg at 11/22/12 0742  . thiamine (B-1) injection 100 mg  100 mg Intramuscular Once [employer], PA-C      . thiamine (VITAMIN B-1) tablet 100 mg  100 mg Oral Daily Kerry Hough,  PA-C   100 mg at 11/22/12 0742  . traZODone (DESYREL) tablet 50 mg  50 mg Oral QHS,MR X 1 Spencer E Simon, PA-C   50 mg at 11/21/12 3086    Lab Results: No results found for this or any previous visit (from the past 48 hour(s)).  Physical Findings: AIMS: Facial and Oral Movements Muscles of Facial Expression: None, normal Lips and Perioral Area: None, normal Jaw: None, normal Tongue: None, normal,Extremity Movements Upper (arms, wrists, hands, fingers): None, normal Lower (legs, knees, ankles, toes): None, normal, Trunk Movements Neck, shoulders, hips: None, normal, Overall Severity Severity of abnormal movements (highest score from questions above): None, normal Incapacitation due to abnormal movements: None,  normal Patient's awareness of abnormal movements (rate only patient's report): No Awareness, Dental Status Current problems with teeth and/or dentures?: No Does patient usually wear dentures?: No  CIWA:  CIWA-Ar Total: 0 COWS:  COWS Total Score: 0  Treatment Plan Summary: Daily contact with patient to assess and evaluate symptoms and progress in treatment Medication management  Plan:  1. Continue crisis management and stabilization. 2. Medication management to reduce current symptoms to base line and improve patient's overall level of functioning 3. Treat health problems as indicated. 4. Develop treatment plan to decrease risk of relapse upon discharge and the need for readmission. 5. Psycho-social education regarding relapse prevention and self care. 6. Health care follow up as needed for medical problems. 7. Continue home medications where appropriate. ELOS: 1-3 days   Medical Decision Making Problem Points:  Review of psycho-social stressors (1) Data Points:  Review of medication regiment & side effects (2)  I certify that inpatient services furnished can reasonably be expected to improve the patient's condition.  Rona Ravens. Mashburn RPAC 8:29 PM 11/22/2012  Reviewed the information documented and agree with the treatment plan.  Korin Setzler,JANARDHAHA R. 11/22/2012 8:48 PM

## 2012-11-23 DIAGNOSIS — F1910 Other psychoactive substance abuse, uncomplicated: Secondary | ICD-10-CM

## 2012-11-23 DIAGNOSIS — F411 Generalized anxiety disorder: Secondary | ICD-10-CM

## 2012-11-23 DIAGNOSIS — F1994 Other psychoactive substance use, unspecified with psychoactive substance-induced mood disorder: Secondary | ICD-10-CM

## 2012-11-23 NOTE — Progress Notes (Signed)
Patient did not attend the evening speaker AA meeting. Pt remained in the bed during group. Pt reported back hurting and feeling tired.  Pt fell asleep.

## 2012-11-23 NOTE — [facility] Group Notes (Signed)
[facility] Group Notes: (Clinical Social Work)   11/23/2012      Type of Therapy:  Group Therapy   Participation Level:  Did Not Attend    Ambrose Mantle, LCSW 11/23/2012, 12:56 PM

## 2012-11-23 NOTE — Progress Notes (Signed)
D   Pt was up this morning and came to the medication window for his medications   He reports feeling a little better  He remains in the wheelchair due to an unsteady gait   He is able to get out of his wheelchair unassisted  And can stand holding on for short periods of time   He did not attend morning group A   Verbal support given   Medications administered and effectiveness monitored   Q 15 min checks   Continue to encourage socialization and groups R   Pt safe at present

## 2012-11-23 NOTE — Progress Notes (Signed)
[facility] MD Progress Note  11/23/2012 4:03 PM Daniel Gray  MRN:  [id number] Subjective:  Slept well, appetite good, depressed but no suicidal ideations, cocaine/alcohol and marijuana dependency.  Denies hallucinations, unsteady on his feet at times, walker and wheelchair in his room.  Patient was in his bed resting when assessed, forwarded little information, except what was asked.  Diagnosis:   Axis I: Anxiety Disorder NOS, Substance Abuse and Substance Induced Mood Disorder Axis II: Deferred Axis III:  Past Medical History  Diagnosis Date  . Chronic mental illness     Unspecified, Pt unable to give any further hx as to what kind.  . Depression   . Seizures   . Anxiety    Axis IV: economic problems, housing problems, occupational problems, other psychosocial or environmental problems, problems related to social environment and problems with primary support group Axis V: 41-50 serious symptoms  ADL's:  Intact  Sleep: Good  Appetite:  Fair  Suicidal Ideation:  Denies Homicidal Ideation:  Denies AEB (as evidenced by):  Psychiatric Specialty Exam: Review of Systems  Constitutional: Negative.   HENT: Negative.   Eyes: Negative.   Respiratory: Negative.   Cardiovascular: Negative.   Gastrointestinal: Negative.   Genitourinary: Negative.   Musculoskeletal: Negative.   Skin: Negative.   Neurological: Negative.   Endo/Heme/Allergies: Negative.   Psychiatric/Behavioral: Positive for depression and substance abuse. The patient is nervous/anxious.     Blood pressure 124/73, pulse 96, temperature 98 F (36.7 C), temperature source Oral, resp. rate 18, height 6\' 1"  (1.854 m), weight 85 kg (187 lb 6.3 oz).Body mass index is 24.73 kg/(m^2).  General Appearance: Disheveled  Eye Solicitor::  Fair  Speech:  Normal Rate  Volume:  Decreased  Mood:  Anxious and Depressed  Affect:  Congruent  Thought Process:  Coherent  Orientation:  Full (Time, Place, and Person)  Thought Content:   WDL  Suicidal Thoughts:  No  Homicidal Thoughts:  No  Memory:  Immediate;   Fair Recent;   Fair Remote;   Fair  Judgement:  Poor  Insight:  Lacking  Psychomotor Activity:  Decreased  Concentration:  Fair  Recall:  Fair  Akathisia:  No  Handed:  Right  AIMS (if indicated):     Assets:  Resilience  Sleep:  Number of Hours: 5.5   Current Medications: Current Facility-Administered Medications  Medication Dose Route Frequency Provider Last Rate Last Dose  . acetaminophen (TYLENOL) tablet 650 mg  650 mg Oral Q6H PRN Kerry Hough, PA-C      . alum & mag hydroxide-simeth (MAALOX/MYLANTA) 200-200-20 MG/5ML suspension 30 mL  30 mL Oral Q4H PRN Kerry Hough, PA-C      . feeding supplement (ENSURE COMPLETE) liquid 237 mL  237 mL Oral BID BM Court Joy, PA-C   237 mL at 11/23/12 0815  . FLUoxetine (PROZAC) capsule 20 mg  20 mg Oral Daily Sanjuana Kava, NP   20 mg at 11/23/12 0813  . gabapentin (NEURONTIN) capsule 200 mg  200 mg Oral TID Rachael Fee, MD   200 mg at 11/23/12 1102  . lidocaine (LIDODERM) 5 % 1 patch  1 patch Transdermal Daily Rachael Fee, MD   1 patch at 11/23/12 0813  . magnesium hydroxide (MILK OF MAGNESIA) suspension 30 mL  30 mL Oral Daily PRN Kerry Hough, PA-C   30 mL at 11/21/12 1634  . multivitamin with minerals tablet 1 tablet  1 tablet Oral Daily Kerry Hough, PA-C  1 tablet at 11/23/12 0813  . nicotine (NICODERM CQ - dosed in mg/24 hours) patch 21 mg  21 mg Transdermal Daily Rachael Fee, MD   21 mg at 11/23/12 [phone]  . thiamine (B-1) injection 100 mg  100 mg Intramuscular Once [employer], PA-C      . thiamine (VITAMIN B-1) tablet 100 mg  100 mg Oral Daily Kerry Hough, PA-C   100 mg at 11/23/12 6213  . traZODone (DESYREL) tablet 50 mg  50 mg Oral QHS,MR X 1 Kerry Hough, PA-C   50 mg at 11/22/12 2208    Lab Results: No results found for this or any previous visit (from the past 48 hour(s)).  Physical Findings: AIMS: Facial and Oral  Movements Muscles of Facial Expression: None, normal Lips and Perioral Area: None, normal Jaw: None, normal Tongue: None, normal,Extremity Movements Upper (arms, wrists, hands, fingers): None, normal Lower (legs, knees, ankles, toes): None, normal, Trunk Movements Neck, shoulders, hips: None, normal, Overall Severity Severity of abnormal movements (highest score from questions above): None, normal Incapacitation due to abnormal movements: None, normal Patient's awareness of abnormal movements (rate only patient's report): No Awareness, Dental Status Current problems with teeth and/or dentures?: No Does patient usually wear dentures?: No  CIWA:  CIWA-Ar Total: 0 COWS:  COWS Total Score: 0  Treatment Plan Summary: Daily contact with patient to assess and evaluate symptoms and progress in treatment Medication management  Plan:  Review of chart, vital signs, medications, and notes. 1-Individual and group therapy 2-Medication management for depression and anxiety:  Medications reviewed with the patient and he stated no untoward effects, no changes made 3-Coping skills for depression, anxiety, and substance abuse 4-Continue crisis stabilization and management 5-Address health issues--monitoring vital signs, stable 6-Treatment plan in progress to prevent relapse of depression, substance abuse, psychosis, and anxiety  Medical Decision Making Problem Points:  Established problem, stable/improving (1) and Review of psycho-social stressors (1) Data Points:  Review of medication regiment & side effects (2)  I certify that inpatient services furnished can reasonably be expected to improve the patient's condition.   Nanine Means, PMH-NP 11/23/2012, 4:03 PM

## 2012-11-23 NOTE — Progress Notes (Signed)
Psychoeducational Group Note  Date:  11/23/2012 Time:  0945 am  Group Topic/Focus:  Identifying Needs:   The focus of this group is to help patients identify their personal needs that have been historically problematic and identify healthy behaviors to address their needs.  Participation Level:  Did Not Attend    Andrena Mews 11/23/2012,9:20 AM

## 2012-11-23 NOTE — Progress Notes (Signed)
D. Pt in room and in bed for much of the evening. Pt did not participate in evening group session. Pt ambulating with the use of a wheelchair and did state that his balance has been off recently and hopes to be able to get it back soon. Pt spoke about how someone came earlier today to help him with that. Pt does endorse feelings of depression and also that he just feels bad. A. Support and encouragement provided, medication education completed. R. Pt verbalized understanding, will continue to monitor.

## 2012-11-23 NOTE — Progress Notes (Signed)
Adult Psychoeducational Group Note  Date:  11/23/2012 Time:  6:28 PM  Group Topic/Focus:  Healthy Communication:   The focus of this group is to discuss communication, barriers to communication, as well as healthy ways to communicate with others.  Participation Level:  Did Not Attend    Daniel Gray 11/23/2012, 6:28 PM

## 2012-11-24 DIAGNOSIS — F1010 Alcohol abuse, uncomplicated: Secondary | ICD-10-CM

## 2012-11-24 MED ORDER — RAMELTEON 8 MG PO TABS
8.0000 mg | ORAL_TABLET | Freq: Every day | ORAL | Status: DC
  Administered 2012-11-24 – 2012-11-26 (×3): 8 mg via ORAL
  Filled 2012-11-24 (×4): qty 1

## 2012-11-24 MED ORDER — NALTREXONE HCL 50 MG PO TABS
25.0000 mg | ORAL_TABLET | Freq: Every day | ORAL | Status: DC
  Administered 2012-11-24 – 2012-11-27 (×4): 25 mg via ORAL
  Filled 2012-11-24 (×5): qty 1

## 2012-11-24 NOTE — Progress Notes (Signed)
D   Pt continues to have an unsteady gait and uses a wheelchair and bedside commode   He has generalized overall weakness but is able to get up and down from chair to bed independently   He is compliant with medications   He did not attend group this morning and has stayed in bed most of the morning   He denies suicidal and homicidal ideation   He reports improved appetite, fair sleep and reports he needs to change friends and residences to better take care of himself A   Verbal support given   Medications administered and effectiveness monitored   Q 15 min checks   Reinforce fall precautions R   Pt safe at present

## 2012-11-24 NOTE — Progress Notes (Signed)
Adult Psychoeducational Group Note  Date:  11/24/2012 Time:  5:12 PM  Group Topic/Focus:  Making Healthy Choices:   The focus of this group is to help patients identify negative/unhealthy choices they were using prior to admission and identify positive/healthier coping strategies to replace them upon discharge.  Participation Level:  Did Not Attend   Daniel Gray 11/24/2012, 5:12 PM

## 2012-11-24 NOTE — Progress Notes (Signed)
Psychoeducational Group Note  Date:  11/24/2012 Time:  0945 am  Group Topic/Focus:  Making Healthy Choices:   The focus of this group is to help patients identify negative/unhealthy choices they were using prior to admission and identify positive/healthier coping strategies to replace them upon discharge.  Participation Level:  Did Not Attend  Daniel Gray 11/24/2012, 10:29 AM

## 2012-11-24 NOTE — Progress Notes (Signed)
Patient did attend the second half of the evening speaker AA meeting.

## 2012-11-24 NOTE — [facility] Group Notes (Signed)
[facility] Group Notes:  (Nursing/MHT/Case Management/Adjunct)  Date:  11/24/2012  Time:  2:50 PM  Type of Therapy:  Nurse Education  Participation Level:  Did Not Attend  Participation Quality:    Affect:    Cognitive:    Insight:    Engagement in Group:    Modes of Intervention:    Summary of Progress/Problems:  Daniel Gray 11/24/2012, 2:50 PM

## 2012-11-24 NOTE — [facility] Group Notes (Signed)
[facility] Group Notes:  (Clinical Social Work)  11/24/2012  10:00-11:00AM  Summary of Progress/Problems:   The main focus of today's process group was to   identify the patient's current support system and decide on other supports that can be put in place.  The picture on workbook was used to discuss why additional supports are needed, and a hand-out was distributed with four definitions/levels of support, then used to talk about how patients have given and received all different kinds of support.  An emphasis was placed on using counselor, doctor, therapy groups, 12-step groups, and problem-specific support groups to expand supports.  The patient identified 2 sisters as supports, left before group concluded without identifying additional supports to put in place, although he appeared engaged in discussion and ideas presented.  Type of Therapy:  Process Group with Motivational Interviewing  Participation Level:  Active  Participation Quality:  Attentive  Affect:  Blunted  Cognitive:  Oriented  Insight:  Improving  Engagement in Therapy:  Improving  Modes of Intervention:   Education, Support and Processing, Activity  Ambrose Mantle, LCSW 11/24/2012, 12:16 PM

## 2012-11-24 NOTE — Progress Notes (Signed)
Patient ID: Daniel Gray, male   DOB: 11-Feb-1956, 57 y.o.   MRN: [id number] D. The patient has a flat affect and depressed mood. He retired early to bed. Urine was observed all over his bathroom floor. Boxes containing both lunch and dinner were observed in his room. Neither of them appeared to have been eaten.  A. A bedside commode was placed at patient's bedside for his convenience. Offered a snack. Trazodone held this evening due to patient complaining that the medicine makes him too lethargic and unsteady on his feet. R. Ate a small snack. Was able to use bedside commode without urinating on floor.

## 2012-11-24 NOTE — Progress Notes (Signed)
[name] Progress Note  11/24/2012 11:27 AM [name]  MRN:  [id number] Subjective:  4/10 depression with no suicidal ideations, "starting to feel better", balance is off and dizzy--remains in a wheelchair, sleep is poor--Trazodone discontinued and Rozerem added, appetite good.  Last night he awoke about 1 am and began to feel worthless, hopeless, and depressed.  Daniel Gray wants to go to [place] for rehab after discharge for his drug and alcohol dependency.  Very little social support, lives alone, never married, no children, two sisters.  Daniel Gray stated his cravings are better but still present--Naltrexone started.  Diagnosis:   Axis I: Alcohol Abuse, Anxiety Disorder NOS, Substance Abuse and Substance Induced Mood Disorder Axis II: Deferred Axis III:  Past Medical History  Diagnosis Date  . Chronic mental illness     Unspecified, Pt unable to give any further hx as to what kind.  . Depression   . Seizures   . Anxiety    Axis IV: economic problems, occupational problems, other psychosocial or environmental problems, problems related to social environment and problems with primary support group Axis V: 41-50 serious symptoms  ADL's:  Intact  Sleep: Poor  Appetite:  Good  Suicidal Ideation:  Denies Homicidal Ideation:  Denies  Psychiatric Specialty Exam: Review of Systems  Constitutional: Negative.   HENT: Negative.   Eyes: Negative.   Respiratory: Negative.   Cardiovascular: Negative.   Gastrointestinal: Negative.   Genitourinary: Negative.   Musculoskeletal: Negative.   Skin: Negative.   Neurological: Positive for dizziness.  Endo/Heme/Allergies: Negative.   Psychiatric/Behavioral: Positive for depression and substance abuse. The patient is nervous/anxious and has insomnia.     Blood pressure 107/67, pulse 90, temperature 97.6 F (36.4 C), temperature source Oral, resp. rate 24, height 6\' 1"  (1.854 m), weight 85 kg (187 lb 6.3 oz).Body mass index is 24.73 kg/(m^2).   General Appearance: Disheveled  Eye Solicitor::  Fair  Speech:  Normal Rate  Volume:  Normal  Mood:  Anxious and Depressed  Affect:  Depressed  Thought Process:  Coherent  Orientation:  Full (Time, Place, and Person)  Thought Content:  WDL  Suicidal Thoughts:  No  Homicidal Thoughts:  No  Memory:  Immediate;   Fair Recent;   Fair Remote;   Fair  Judgement:  Poor  Insight:  Fair  Psychomotor Activity:  Decreased  Concentration:  Fair  Recall:  Fair  Akathisia:  No  Handed:  Right  AIMS (if indicated):     Assets:  Resilience  Sleep:  Number of Hours: 1.5   Current Medications: Current Facility-Administered Medications  Medication Dose Route Frequency Provider Last Rate Last Dose  . acetaminophen (TYLENOL) tablet 650 mg  650 mg Oral Q6H PRN Kerry Hough, PA-C      . alum & mag hydroxide-simeth (MAALOX/MYLANTA) 200-200-20 MG/5ML suspension 30 mL  30 mL Oral Q4H PRN Kerry Hough, PA-C      . feeding supplement (ENSURE COMPLETE) liquid 237 mL  237 mL Oral BID BM Court Joy, PA-C   237 mL at 11/24/12 0955  . FLUoxetine (PROZAC) capsule 20 mg  20 mg Oral Daily Sanjuana Kava, NP   20 mg at 11/24/12 7829  . gabapentin (NEURONTIN) capsule 200 mg  200 mg Oral TID Rachael Fee, MD   200 mg at 11/24/12 0953  . lidocaine (LIDODERM) 5 % 1 patch  1 patch Transdermal Daily Rachael Fee, MD   1 patch at 11/24/12 [phone]  . magnesium hydroxide (MILK  OF MAGNESIA) suspension 30 mL  30 mL Oral Daily PRN Kerry Hough, PA-C   30 mL at 11/24/12 4098  . multivitamin with minerals tablet 1 tablet  1 tablet Oral Daily Kerry Hough, PA-C   1 tablet at 11/24/12 1191  . naltrexone (DEPADE) tablet 25 mg  25 mg Oral Daily Nanine Means, NP      . nicotine (NICODERM CQ - dosed in mg/24 hours) patch 21 mg  21 mg Transdermal Daily Rachael Fee, MD   21 mg at 11/24/12 0951  . ramelteon (ROZEREM) tablet 8 mg  8 mg Oral QHS Nanine Means, NP      . thiamine (B-1) injection 100 mg  100 mg Intramuscular  Once Spencer E Simon, PA-C      . thiamine (VITAMIN B-1) tablet 100 mg  100 mg Oral Daily Kerry Hough, PA-C   100 mg at 11/24/12 4782    Lab Results: No results found for this or any previous visit (from the past 48 hour(s)).  Physical Findings: AIMS: Facial and Oral Movements Muscles of Facial Expression: None, normal Lips and Perioral Area: None, normal Jaw: None, normal Tongue: None, normal,Extremity Movements Upper (arms, wrists, hands, fingers): None, normal Lower (legs, knees, ankles, toes): None, normal, Trunk Movements Neck, shoulders, hips: None, normal, Overall Severity Severity of abnormal movements (highest score from questions above): None, normal Incapacitation due to abnormal movements: None, normal Patient's awareness of abnormal movements (rate only patient's report): No Awareness, Dental Status Current problems with teeth and/or dentures?: No Does patient usually wear dentures?: No  CIWA:  CIWA-Ar Total: 0 COWS:  COWS Total Score: 0  Treatment Plan Summary: Daily contact with patient to assess and evaluate symptoms and progress in treatment Medication management  Plan:  Review of chart, vital signs, medications, and notes. 1-Individual and group therapy 2-Medication management for depression and anxiety:  Medications reviewed with the patient and Naltrexone started for cravings, Trazodone discontinued, Rozerem started 3-Coping skills for depression, anxiety, and alcohol abuse 4-Continue crisis stabilization and management 5-Address health issues--monitoring vital signs, stable 6-Treatment plan in progress to prevent relapse of depression, alcohol abuse, and anxiety  Medical Decision Making Problem Points:  Established problem, stable/improving (1) and Review of psycho-social stressors (1) Data Points:  Review of new medications or change in dosage (2)  I certify that inpatient services furnished can reasonably be expected to improve the patient's  condition.   Nanine Means, PMH-NP 11/24/2012, 11:27 AM

## 2012-11-25 DIAGNOSIS — F329 Major depressive disorder, single episode, unspecified: Secondary | ICD-10-CM

## 2012-11-25 LAB — COMPREHENSIVE METABOLIC PANEL
AST: 88 U/L — ABNORMAL HIGH (ref 0–37)
CO2: 25 mEq/L (ref 19–32)
Chloride: 105 mEq/L (ref 96–112)
Creatinine, Ser: 0.73 mg/dL (ref 0.50–1.35)
GFR calc non Af Amer: >90 mL/min (ref >90)
Total Bilirubin: 0.5 mg/dL (ref 0.3–1.2)

## 2012-11-25 NOTE — Progress Notes (Signed)
Adult Psychoeducational Group Note  Date:  11/25/2012 Time:  11:00PM Group Topic/Focus:  Wellness Toolbox:   The focus of this group is to discuss various aspects of wellness, balancing those aspects and exploring ways to increase the ability to experience wellness.  Patients will create a wellness toolbox for use upon discharge.  Participation Level:  Did Not Attend   Additional Comments: Pt. Didn't attend group.   Bing Plume D 11/25/2012, 12:01 PM

## 2012-11-25 NOTE — Progress Notes (Signed)
PT Cancellation Note  Patient Details Name: Marqui Formby MRN: [id number] DOB: 1955/11/12   Cancelled Treatment:    Reason Eval/Treat Not Completed: pt declined to participate. Requested PT check back another time. Will check back on tomorrow. Thanks.    Rebeca Alert, MPT Pager: [phone]

## 2012-11-25 NOTE — Progress Notes (Signed)
[facility] MD Progress Note  11/25/2012 3:15 PM Daniel Gray  MRN:  [id number] Subjective:  Daniel Gray states that he has been feeling down. States that he did not sleep that well last night. He had and interview with [facility] today. He will like to go to that program. He would like to have "something stronger' for his back pain but accepts that we cant do opioids. He is being encouraged to stay out of bed today so he can sleep better tonight.  Diagnosis:  Cocaine Dependence, Depressive Disorder NOS  ADL's:  Intact  Sleep: Poor  Appetite:  Fair  Suicidal Ideation:  Plan:  denies Intent:  denies Means:  denies Homicidal Ideation:  Plan:  denies Intent:  denies Means:  denies AEB (as evidenced by):  Psychiatric Specialty Exam: Review of Systems  HENT: Negative.   Eyes: Negative.   Respiratory: Negative.   Cardiovascular: Negative.   Gastrointestinal: Negative.   Genitourinary: Negative.   Musculoskeletal: Positive for back pain.  Skin: Negative.   Neurological: Positive for weakness.  Endo/Heme/Allergies: Negative.   Psychiatric/Behavioral: Positive for substance abuse. The patient is nervous/anxious.     Blood pressure 118/69, pulse 77, temperature 97.4 F (36.3 C), temperature source Oral, resp. rate 24, height 6\' 1"  (1.854 m), weight 85 kg (187 lb 6.3 oz).Body mass index is 24.73 kg/(m^2).  General Appearance: Fairly Groomed  Patent attorney::  Fair  Speech:  Clear and Coherent and Slow  Volume:  Decreased  Mood:  Anxious and sad worried  Affect:  Restricted  Thought Process:  Coherent and Goal Directed  Orientation:  Full (Time, Place, and Person)  Thought Content:  worries, concerns  Suicidal Thoughts:  No  Homicidal Thoughts:  No  Memory:  Immediate;   Fair Recent;   Fair Remote;   Fair  Judgement:  Fair  Insight:  Shallow  Psychomotor Activity:  Restlessness  Concentration:  Fair  Recall:  Fair  Akathisia:  No  Handed:  Right  AIMS (if indicated):     Assets:   Desire for Improvement  Sleep:  Number of Hours: 2.5   Current Medications: Current Facility-Administered Medications  Medication Dose Route Frequency Provider Last Rate Last Dose  . acetaminophen (TYLENOL) tablet 650 mg  650 mg Oral Q6H PRN Kerry Hough, PA-C      . alum & mag hydroxide-simeth (MAALOX/MYLANTA) 200-200-20 MG/5ML suspension 30 mL  30 mL Oral Q4H PRN Kerry Hough, PA-C      . feeding supplement (ENSURE COMPLETE) liquid 237 mL  237 mL Oral BID BM Court Joy, PA-C   237 mL at 11/25/12 1447  . FLUoxetine (PROZAC) capsule 20 mg  20 mg Oral Daily Sanjuana Kava, NP   20 mg at 11/25/12 0932  . gabapentin (NEURONTIN) capsule 200 mg  200 mg Oral TID Rachael Fee, MD   200 mg at 11/25/12 1150  . lidocaine (LIDODERM) 5 % 1 patch  1 patch Transdermal Daily Rachael Fee, MD   1 patch at 11/25/12 [phone]  . magnesium hydroxide (MILK OF MAGNESIA) suspension 30 mL  30 mL Oral Daily PRN Kerry Hough, PA-C   30 mL at 11/24/12 0617  . multivitamin with minerals tablet 1 tablet  1 tablet Oral Daily Kerry Hough, PA-C   1 tablet at 11/25/12 0932  . naltrexone (DEPADE) tablet 25 mg  25 mg Oral Daily Nanine Means, NP   25 mg at 11/25/12 0932  . nicotine (NICODERM CQ - dosed in mg/24  hours) patch 21 mg  21 mg Transdermal Daily Rachael Fee, MD   21 mg at 11/25/12 0932  . ramelteon (ROZEREM) tablet 8 mg  8 mg Oral QHS Nanine Means, NP   8 mg at 11/24/12 2136  . thiamine (B-1) injection 100 mg  100 mg Intramuscular Once [employer], PA-C      . thiamine (VITAMIN B-1) tablet 100 mg  100 mg Oral Daily Kerry Hough, PA-C   100 mg at 11/25/12 1610    Lab Results: No results found for this or any previous visit (from the past 48 hour(s)).  Physical Findings: AIMS: Facial and Oral Movements Muscles of Facial Expression: None, normal Lips and Perioral Area: None, normal Jaw: None, normal Tongue: None, normal,Extremity Movements Upper (arms, wrists, hands, fingers): None,  normal Lower (legs, knees, ankles, toes): None, normal, Trunk Movements Neck, shoulders, hips: None, normal, Overall Severity Severity of abnormal movements (highest score from questions above): None, normal Incapacitation due to abnormal movements: None, normal Patient's awareness of abnormal movements (rate only patient's report): No Awareness, Dental Status Current problems with teeth and/or dentures?: No Does patient usually wear dentures?: No  CIWA:  CIWA-Ar Total: 0 COWS:  COWS Total Score: 0  Treatment Plan Summary: Daily contact with patient to assess and evaluate symptoms and progress in treatment Medication management  Plan: Supportive approach/coping skills/relapse prevention           Reassess co morbidities/reassess electrolytes CMET  Medical Decision Making Problem Points:  Review of psycho-social stressors (1) Data Points:  Review of medication regiment & side effects (2)  I certify that inpatient services furnished can reasonably be expected to improve the patient's condition.   [name] 11/25/2012, 3:15 PM

## 2012-11-25 NOTE — Progress Notes (Signed)
Patient ID: Daniel Gray, male   DOB: January 14, 1956, 57 y.o.   MRN: [id number] PER STATE REGULATIONS 482.30  THIS CHART WAS REVIEWED FOR MEDICAL NECESSITY WITH RESPECT TO THE PATIENT'S ADMISSION/ DURATION OF STAY.  NEXT REVIEW DATE:  11/29/2012  Willa Rough, RN, BSN CASE MANAGER

## 2012-11-25 NOTE — Progress Notes (Signed)
Patient ID: Daniel Gray, male   DOB: 08-19-1955, 57 y.o.   MRN: [id number] D-Reports that he slept half the night and reported sleep as fair.Appetite is good and ate 100% of breakfast. He rates his depression and hopelessness as a five.He denies any thoughts to hurt self or others.He is complaining of dizziness, tremors and sedation. He states he has thoughts of substance use but not craving, the patch helps him a lot .He talked with Dr. Dub Mikes and he encouraged him to stay out of his room to help with his sleep.A-encouraged him to stay out of his room, but due to his chronic back pain. R- he asked to lie down and was allowed to do so. Also, reviewed fall risk precautions with patient and told him that staff would be checking on him frequently to make sure he stays awake while he stretches out his back.

## 2012-11-25 NOTE — BHH Group Notes (Signed)
[facility] LCSW Aftercare Discharge Planning Group Note   11/25/2012 9:26 AM  Participation Quality:  DID NOT ATTEND  Smart, Herbert Seta

## 2012-11-25 NOTE — BHH Group Notes (Signed)
[facility] LCSW Group Therapy  11/25/2012 2:54 PM  Type of Therapy:  Group Therapy  Participation Level:  Did Not Attend  Smart, Herbert Seta 11/25/2012, 2:54 PM

## 2012-11-25 NOTE — Progress Notes (Signed)
Patient ID: Daniel Gray, male   DOB: [birth year], 57 y.o.   MRN: [id number] The patient had been in bed sleeping prior to the evening AA/NA meeting. When he woke up he joined the group that was already in progress. Afterwards he spoke in a logical, relevant manner about the meeting. Stated it was very interesting and inspirational. He is hoping he can go to 28 day program after discharge. He is not eating well. He had not eaten his dinner  And was given a sandwich for snack. He needed frequent reminding to eat it. Will continue to monitor nutrition.

## 2012-11-26 NOTE — Progress Notes (Signed)
Adult Psychoeducational Group Note  Date:  11/26/2012 Time:  9:22 PM  Group Topic/Focus:  Identifying Needs:   The focus of this group is to help patients identify their personal needs that have been historically problematic and identify healthy behaviors to address their needs.  Participation Level:  Active  Participation Quality:  Appropriate, Attentive, Sharing and Supportive  Affect:  Appropriate and Excited  Cognitive:  Alert, Appropriate and Oriented  Insight: Appropriate and Good  Engagement in Group:  Supportive  Modes of Intervention:  Discussion, Problem-solving and Support  Additional Comments:  Guhan was able to share with the group how his self care card related to his everyday life.  He stated that he felt that the group was a confirmation that he was headed in the right direction.    Annell Greening Catharine 11/26/2012, 9:22 PM

## 2012-11-26 NOTE — Progress Notes (Signed)
Recreation Therapy Notes  Date: 08.19.2014 Time: 2:30pm Location: [address] Dayroom  Group Topic: Animal Assisted Activities  Goal Area(s) Addresses:  Patient will interact appropriately with dog team.    Behavioral Response: Did not attend  Genelda Roark L Ronnette Rump, LRT/CTRS  Pavneet Markwood L 11/26/2012 4:34 PM 

## 2012-11-26 NOTE — Progress Notes (Signed)
Recreation Therapy Notes  Date: 08.19.2014 Time: 3:00pm Location: [address] Dayroom  Group Topic: Communication, Team Building, Problem Solving  Goal Area(s) Addresses:  Patient will be able to recognize use of communication, team building and problem solving during course of group activity. Patient will verbalize qualities used to make decisions during group session.  Patient will verbalize ability to use skills to build healthy support system post d/c.   Behavioral Response: Did not attend  Minh Roanhorse L Marjie Chea, LRT/CTRS  Keylen Uzelac L 11/26/2012 3:57 PM 

## 2012-11-26 NOTE — BHH Group Notes (Signed)
[facility] LCSW Group Therapy  11/26/2012 4:07 PM  Type of Therapy:  Group Therapy  Participation Level:  Did Not Attend   Smart, Herbert Seta 11/26/2012, 4:07 PM

## 2012-11-26 NOTE — Clinical Social Work Note (Signed)
CSW met with pt today in his room. Pt stated that interview with [facility] went well yesterday and he was accepted into program. Pt stated that he is working hard to get physically stronger. CSW to contact [facility] today to verify that pt was accepted.

## 2012-11-26 NOTE — Progress Notes (Signed)
The focus of this group is to educate the patient on the purpose and policies of crisis stabilization and provide a format to answer questions about their admission.  The group details unit policies and expectations of patients while admitted. Patient did not attend. 

## 2012-11-26 NOTE — Progress Notes (Signed)
Patient ID: Daniel Gray, male   DOB: 07/09/1955, 57 y.o.   MRN: [id number] D: Pt. Reports "they took blood to see what wrong, sometimes I got energy and other times I have to go lay down" "group real good" Pt. [name] being here "is the best thing that ever happen"  Pt. Reports when he leave he will be going to his house "get things in order then I'm going to [place] for 4 months. A: Writer introduced self to client and encouraged treatment. Staff will monitor q31min for safety. R: Pt. Is safe on the unit. Pt. Attended group.

## 2012-11-26 NOTE — Progress Notes (Signed)
Patient ID: Maurie Olesen, male   DOB: 1955-04-12, 57 y.o.   MRN: [id number] [facility] MD Progress Note  11/26/2012 2:16 PM Conor Lata  MRN:  [id number]  Subjective:  Mercedes states that his mood has improved since his interview with the [facility] yesterday. He says that he has been accepted to participate in the substance abuse treatment program, however, he will need to be discharge to his home to take care of some personal business. He says he needs few days to have his sister made his power of attorney prior to going to [facility] in [place]. He currently denies any SIHI. Has been observed trying to ambulate with the aid of walker. Is being seen seen by PT for balance and gait training for safety.  OFayrene Fearing appears to be be showing an improved affect, mental capacity and physical strength. He is currently ambulating short distances without walker and or wheel chair. However, has been using wheel to mobilize within the unit. Uses walker on a prn basis.  Diagnosis:  Cocaine Dependence, Depressive Disorder NOS  ADL's:  Intact  Sleep: Poor  Appetite:  Fair  Suicidal Ideation:  Plan:  denies Intent:  denies Means:  denies Homicidal Ideation:  Plan:  denies Intent:  denies Means:  denies  AEB (as evidenced by):  Psychiatric Specialty Exam: Review of Systems  HENT: Negative.   Eyes: Negative.   Respiratory: Negative.   Cardiovascular: Negative.   Gastrointestinal: Negative.   Genitourinary: Negative.   Musculoskeletal: Positive for back pain.  Skin: Negative.   Neurological: Positive for weakness.  Endo/Heme/Allergies: Negative.   Psychiatric/Behavioral: Positive for substance abuse. The patient is nervous/anxious.     Blood pressure 113/69, pulse 90, temperature 97.4 F (36.3 C), temperature source Oral, resp. rate 16, height 6\' 1"  (1.854 m), weight 85 kg (187 lb 6.3 oz).Body mass index is 24.73 kg/(m^2).  General Appearance: Fairly Groomed  Proofreader::  Fair  Speech:  Clear and Coherent and Slow  Volume:  Decreased  Mood:  Anxious and sad worried  Affect:  Restricted  Thought Process:  Coherent and Goal Directed  Orientation:  Full (Time, Place, and Person)  Thought Content:  worries, concerns  Suicidal Thoughts:  No  Homicidal Thoughts:  No  Memory:  Immediate;   Fair Recent;   Fair Remote;   Fair  Judgement:  Fair  Insight:  Shallow  Psychomotor Activity:  Within Normal  Concentration:  Fair  Recall:  Fair  Akathisia:  No  Handed:  Right  AIMS (if indicated):     Assets:  Desire for Improvement  Sleep:  Number of Hours: 5   Current Medications: Current Facility-Administered Medications  Medication Dose Route Frequency Provider Last Rate Last Dose  . acetaminophen (TYLENOL) tablet 650 mg  650 mg Oral Q6H PRN Kerry Hough, PA-C      . alum & mag hydroxide-simeth (MAALOX/MYLANTA) 200-200-20 MG/5ML suspension 30 mL  30 mL Oral Q4H PRN Kerry Hough, PA-C      . feeding supplement (ENSURE COMPLETE) liquid 237 mL  237 mL Oral BID BM Court Joy, PA-C   237 mL at 11/26/12 1000  . FLUoxetine (PROZAC) capsule 20 mg  20 mg Oral Daily Sanjuana Kava, NP   20 mg at 11/26/12 0750  . gabapentin (NEURONTIN) capsule 200 mg  200 mg Oral TID Rachael Fee, MD   200 mg at 11/26/12 1312  . lidocaine (LIDODERM) 5 % 1 patch  1 patch  Transdermal Daily Rachael Fee, MD   1 patch at 11/26/12 [phone]  . magnesium hydroxide (MILK OF MAGNESIA) suspension 30 mL  30 mL Oral Daily PRN Kerry Hough, PA-C   30 mL at 11/24/12 0617  . multivitamin with minerals tablet 1 tablet  1 tablet Oral Daily Kerry Hough, PA-C   1 tablet at 11/26/12 0750  . naltrexone (DEPADE) tablet 25 mg  25 mg Oral Daily Nanine Means, NP   25 mg at 11/26/12 0750  . nicotine (NICODERM CQ - dosed in mg/24 hours) patch 21 mg  21 mg Transdermal Daily Rachael Fee, MD   21 mg at 11/26/12 0750  . ramelteon (ROZEREM) tablet 8 mg  8 mg Oral QHS Nanine Means, NP   8 mg  at 11/25/12 2129  . thiamine (B-1) injection 100 mg  100 mg Intramuscular Once Spencer E Simon, PA-C      . thiamine (VITAMIN B-1) tablet 100 mg  100 mg Oral Daily Kerry Hough, PA-C   100 mg at 11/26/12 1308    Lab Results:  Results for orders placed during the hospital encounter of 11/13/12 (from the past 48 hour(s))  COMPREHENSIVE METABOLIC PANEL     Status: Abnormal   Collection Time    11/25/12  8:00 PM      Result Value Range   Sodium 135  135 - 145 mEq/L   Potassium 4.4  3.5 - 5.1 mEq/L   Chloride 105  96 - 112 mEq/L   CO2 25  19 - 32 mEq/L   Glucose, Bld 141 (*) 70 - 99 mg/dL   BUN 10  6 - 23 mg/dL   Creatinine, Ser 6.57  0.50 - 1.35 mg/dL   Calcium 8.7  8.4 - 84.6 mg/dL   Total Protein 6.2  6.0 - 8.3 g/dL   Albumin 2.4 (*) 3.5 - 5.2 g/dL   AST 88 (*) 0 - 37 U/L   ALT 78 (*) 0 - 53 U/L   Alkaline Phosphatase 63  39 - 117 U/L   Total Bilirubin 0.5  0.3 - 1.2 mg/dL   GFR calc non Af Amer >90  >90 mL/min   GFR calc Af Amer >90  >90 mL/min   Comment: (NOTE)     The eGFR has been calculated using the CKD EPI equation.     This calculation has not been validated in all clinical situations.     eGFR's persistently <90 mL/min signify possible Chronic Kidney     Disease.     Performed at [facility]    Physical Findings: AIMS: Facial and Oral Movements Muscles of Facial Expression: None, normal Lips and Perioral Area: None, normal Jaw: None, normal Tongue: None, normal,Extremity Movements Upper (arms, wrists, hands, fingers): None, normal Lower (legs, knees, ankles, toes): None, normal, Trunk Movements Neck, shoulders, hips: None, normal, Overall Severity Severity of abnormal movements (highest score from questions above): None, normal Incapacitation due to abnormal movements: None, normal Patient's awareness of abnormal movements (rate only patient's report): No Awareness, Dental Status Current problems with teeth and/or dentures?: No Does patient  usually wear dentures?: No  CIWA:  CIWA-Ar Total: 0 COWS:  COWS Total Score: 0  Treatment Plan Summary: Daily contact with patient to assess and evaluate symptoms and progress in treatment Medication management  Plan: Supportive approach/coping skills/relapse prevention Reassess co morbidities/reassess electrolytes CMET. Facilitate discharge plans to [facility] in [place]. Continue current plan of care.  Medical Decision Making  Problem Points:  Review of psycho-social stressors (1) Data Points:  Review of medication regiment & side effects (2)  I certify that inpatient services furnished can reasonably be expected to improve the patient's condition.   Armandina Stammer I, PMHNP-BC 11/26/2012, 2:16 PM

## 2012-11-26 NOTE — Progress Notes (Addendum)
Physical Therapy Treatment D/C from therapy Patient Details Name: Ruxin Ransome MRN: [id number] DOB: 02-03-1956 Today's Date: 11/26/2012 Time: 5621-3086 PT Time Calculation (min): 10 min  PT Assessment / Plan / Recommendation  History of Present Illness 57 yo male admitted to [facility] with cocaine dependence, back pain, unsteady gait. Hx of polysubstance abuse, Sz, anxiety, TBI.    PT Comments   Mobility has significantly improved. Pt mobilizing well-at supervision level. Planning d/c home tomorrow. Will d/c from PT.   Follow Up Recommendations  Home health PT     Does the patient have the potential to tolerate intense rehabilitation     Barriers to Discharge        Equipment Recommendations  Rolling walker with 5" wheels    Recommendations for Other Services    Frequency Min 2X/week   Progress towards PT Goals Progress towards PT goals: Progressing toward goals  Plan Discharge plan needs to be updated    Precautions / Restrictions Precautions Precautions: Fall Restrictions Weight Bearing Restrictions: No   Pertinent Vitals/Pain Pt denies pain    Mobility  Bed Mobility Bed Mobility: Supine to Sit;Sit to Supine Supine to Sit: 7: Independent Sit to Supine: 7: Independent Transfers Transfers: Sit to Stand;Stand to Sit Sit to Stand: 6: Modified independent (Device/Increase time);From bed Stand to Sit: 6: Modified independent (Device/Increase time);To bed Ambulation/Gait Ambulation/Gait Assistance: 5: Supervision Ambulation Distance (Feet): 150 Feet Assistive device: Rolling walker Ambulation/Gait Assistance Details: 150'x1 with RW, 75'x1 without assistive device. VCs safety, distance from RW. No LOB.     Exercises General Exercises - Lower Extremity Ankle Circles/Pumps: AROM;Both;10 reps;Standing Hip ABduction/ADduction: AROM;Both;10 reps;Standing Other Exercises Other Exercises: SLS while holding onto rail, L and R, Min guard assist   PT Diagnosis:    PT Problem  List:   PT Treatment Interventions:     PT Goals (current goals can now be found in the care plan section)    Visit Information  Last PT Received On: 11/26/12 Assistance Needed: +1 History of Present Illness: 57 yo male admitted to [facility] with cocaine dependence, back pain, unsteady gait. Hx of polysubstance abuse, Sz, anxiety, TBI.     Subjective Data      Cognition  Cognition Arousal/Alertness: Awake/alert Behavior During Therapy: WFL for tasks assessed/performed Overall Cognitive Status: Within Functional Limits for tasks assessed    Balance     End of Session PT - End of Session Activity Tolerance: Patient tolerated treatment well Patient left: in bed;with call bell/phone within reach   GP     Rebeca Alert, MPT Pager: [phone]

## 2012-11-26 NOTE — Progress Notes (Signed)
D:  Patient up and present in the milieu most of the day.  Has attended and participated in most groups.  Is using the wheelchair most of the time.  He did receive word today that he has been accepted into [facility], the residential program in [place].  Will be discharged tomorrow and plans to travel there on Monday.  He continues to rate depression and hopelessness high at 7, but has been up and interacting with peers and going to meals.  He denies thoughts of suicide or self harm.   A:  Medications given as prescribed.  Encouraged participation in all groups.  Talked with patient about his discharge plan and his plan to stay safe between now and Monday.  Offered support and encouragement.  R:  Affect remains flat, but brightens up some when engaged in conversation.  Interacting well with peers.  Patient states he will be going to his local church tomorrow for a revival service and will be checking in with the staff at [facility] daily until he arrives there.  Safety is maintained on the unit.

## 2012-11-26 NOTE — Progress Notes (Signed)
Adult Psychoeducational Group Note  Date:  11/26/2012 Time:  1:07 PM  Group Topic/Focus:  Recovery Goals:   The focus of this group is to identify appropriate goals for recovery and establish a plan to achieve them.  Participation Level:  Active  Participation Quality:  Appropriate, Sharing and Supportive  Affect:  Appropriate  Cognitive:  Appropriate  Insight: Appropriate  Engagement in Group:  Engaged and Supportive  Modes of Intervention:  Discussion, Education and Support  Additional Comments:  Pt was appropriate  and participated well.  Isla Pence M 11/26/2012, 1:07 PM

## 2012-11-27 MED ORDER — RAMELTEON 8 MG PO TABS: 8.0000 mg | ORAL_TABLET | Freq: Every day | ORAL | Status: AC

## 2012-11-27 MED ORDER — LIDOCAINE 5 % EX PTCH: 1.0000 | MEDICATED_PATCH | Freq: Every day | CUTANEOUS | Status: DC

## 2012-11-27 MED ORDER — LIDOCAINE 5 % EX PTCH: 1.0000 | MEDICATED_PATCH | Freq: Every day | CUTANEOUS | Status: AC

## 2012-11-27 MED ORDER — NALTREXONE HCL 50 MG PO TABS: 25.0000 mg | ORAL_TABLET | Freq: Every day | ORAL | Status: AC

## 2012-11-27 MED ORDER — GABAPENTIN 100 MG PO CAPS: 200.0000 mg | ORAL_CAPSULE | Freq: Three times a day (TID) | ORAL | Status: AC

## 2012-11-27 MED ORDER — FLUOXETINE HCL 20 MG PO CAPS: 20.0000 mg | ORAL_CAPSULE | Freq: Every day | ORAL | Status: AC

## 2012-11-27 NOTE — Progress Notes (Signed)
[facility] Adult Case Management Discharge Plan :  Will you be returning to the same living situation after discharge: No. At discharge, do you have transportation home?:Yes,  sister Do you have the ability to pay for your medications:Yes,  Medicare  Release of information consent forms completed and in the chart;  Patient's signature needed at discharge.  Patient to Follow up at: Follow-up Information   Follow up with [facility]. (Take bus to [facility]. (bus pass will be given to you at discharge). Call [facility] daily to keep in touch with them until departure date. )    Contact information:   [address] [place], [place] [address] Phone: [phone] Fax: [phone]      Patient denies SI/HI:   Yes,  during group/self report    Safety Planning and Suicide Prevention discussed:  Yes,  SPE completed with pt as he did not consent to family contact. SPI pamphlet provided to pt and pt encouraged to ask questions and talk about any concerns.   Smart, Daniel Gray 11/27/2012, 10:01 AM

## 2012-11-27 NOTE — Tx Team (Signed)
Interdisciplinary Treatment Plan Update (Adult)  Date: 11/27/2012   Time Reviewed: 9:41 AM  Progress in Treatment:  Attending groups:No. Participating in groups: No. Taking medication as prescribed: Yes  Tolerating medication: Yes  Family/Significant othe contact made: Pt refused to consent to family contact. SPE completed with pt.  Patient understands diagnosis: Yes, AEB seeking treatment for SI, substance abuse, and ETOH detox.  Discussing patient identified problems/goals with staff: Yes  Medical problems stabilized or resolved: Yes  Denies suicidal/homicidal ideation: Yes. Self report Patient has not harmed self or Others: Yes  New problem(s) identified:  Discharge Plan or Barriers: Daniel Gray was admitted into [facility]. Bus ticket sent. CSW put in chart. Pt d/cing today and reports that his sister will be picking him up at 11am.  Additional comments: n/a  Reason for Continuation of Hospitalization: d/c today Estimated length of stay: d/c today For review of initial/current patient goals, please see plan of care.  Attendees:  Patient:    Family:    Physician: Dr. Dub Mikes MD 11/27/2012 9:42 AM   Nursing: Lupita Leash RN  11/27/2012 9:41 AM   Clinical Social Worker Hisako Bugh Smart, LCSWA  11/27/2012 9:41 AM   Other: Darden Dates Nurse CM 11/27/2012 9:41 AM   Other: Aggie PA 11/27/2012 9:41 AM   Other: Philippa Chester RN 11/27/2012 9:57 AM   Other:     Scribe for Treatment Team:  [employer] LCSWA 11/27/2012 9:41 AM

## 2012-11-27 NOTE — Progress Notes (Signed)
Pt d/c from hospital with his sister. All items returned. D/C instructions given, prescriptions given and samples given. Pt denies si and hi.

## 2012-11-27 NOTE — BHH Suicide Risk Assessment (Signed)
Suicide Risk Assessment  Discharge Assessment     Demographic Factors:  Male and Caucasian  Mental Status Per Nursing Assessment::   On Admission:  NA  Current Mental Status by Physician: In full contact with reality. There are no suicidal ideas, plans or intent. His mood is euthymic, his affect is appropriate. He is willing and motivated to pursue rehab.    Loss Factors: NA  Historical Factors: NA  Risk Reduction Factors:   Positive social support  Continued Clinical Symptoms:  Depression:   Comorbid alcohol abuse/dependence Alcohol/Substance Abuse/Dependencies  Cognitive Features That Contribute To Risk:  Closed-mindedness Polarized thinking Thought constriction (tunnel vision)    Suicide Risk:  Minimal: No identifiable suicidal ideation.  Patients presenting with no risk factors but with morbid ruminations; may be classified as minimal risk based on the severity of the depressive symptoms  Discharge Diagnoses:   AXIS I:  Cocaine Dependence, Cocaine induced psychotic features, Depressive Disorder NOS AXIS II:  Deferred AXIS III:   Past Medical History  Diagnosis Date  . Chronic mental illness     Unspecified, Pt unable to give any further hx as to what kind.  . Depression   . Seizures   . Anxiety    AXIS IV:  other psychosocial or environmental problems AXIS V:  61-70 mild symptoms  Plan Of Care/Follow-up recommendations:  Activity:  as tolerated Diet:  regular Follow up with [facility] in [place] Is patient on multiple antipsychotic therapies at discharge:  No   Has Patient had three or more failed trials of antipsychotic monotherapy by history:  No  Recommended Plan for Multiple Antipsychotic Therapies: N/A   Daniel Gray A 11/27/2012, 10:57 AM

## 2012-11-27 NOTE — Discharge Summary (Signed)
Physician Discharge Summary Note  Patient:  Daniel Gray is an 57 y.o., male MRN:  [id number] DOB:  02-Jun-1955 Patient phone:  [phone] (home)  Patient address:   [address] [place] [address],   Date of Admission:  11/13/2012 Date of Discharge: 11/27/12  Reason for Admission: Drug detox, Psychosis  Discharge Diagnoses: Principal Problem:   Psychosis Active Problems:   Cocaine dependence  Review of Systems  Constitutional: Negative.   HENT: Negative.   Eyes: Negative.   Respiratory: Negative.   Cardiovascular: Negative.   Gastrointestinal: Negative.   Genitourinary: Negative.   Musculoskeletal: Negative.   Skin: Negative.   Neurological: Negative.   Endo/Heme/Allergies: Negative.   Psychiatric/Behavioral: Positive for depression (Stabilized with medication prior to discharge), hallucinations (Hx psychosis) and substance abuse (Alcohol dependence). Negative for suicidal ideas and memory loss. The patient is nervous/anxious (Stabilized with medication prior to discharge) and has insomnia (Stabilized with medication prior to discharge).    Axis Diagnosis:   AXIS I:  Cocaine dependence, Psychotic disorder AXIS II:  Deferred AXIS III:   Past Medical History  Diagnosis Date  . Chronic mental illness     Unspecified, Pt unable to give any further hx as to what kind.  . Depression   . Seizures   . Anxiety    AXIS IV:  other psychosocial or environmental problems and Substance dependence issues AXIS V:  63  Level of Care:  [place]  Hospital Course:  57 Y/O male who endorses he uses cocaine (crack)every day. States that two years ago he was introduced to cocaine. Before used to smoke some pot, drink a little. Has visual hallucinations of a male who tells him to hurt himself (old man with a dog, black dog with red eyes) Before he sees the man the room turns cold and it has a "bad smell." The man stays there for 10-15 minutes. If he tries to fall asleep they try to  suffocate him so he does not fall asleep. He sleeps during the day. This has been going for four or five years. He also uses Xanax apparently prescribed.  Upon admission into this hospital, and after admission assessment/evaluation, it was determined that Daniel Gray will need detoxification treatment protocol to stabilize his system of drug intoxications and to combat the withdrawal symptoms of these substances as well. And his discharge plans included a referral to a long term treatment facility in [place] for a more intense substance abuse treatment. Daniel Gray was then started on Librium protocol for his drug detoxification. He was also enrolled in group counseling sessions and activities where he was counseled, taught and learned coping skills that should help him after discharge to cope better, manage his substance abuse problems to maintain a much longer sobriety. He also was enrolled and attended AA/NA meetings being offered and held on this unit. Daniel Gray has some previously existing and or identifiable medical conditions that required treatment and or monitoring. He received medication management/monitoring for all those health issues as well. He was also on 1:1 supervision for safety due to impaired gait and balance. He also received Physical therapy consult, evaluation and treatment as well while a patient in this hospital. Daniel Gray within the unit mostly with a wheel chair which he propelled himself. Later he progressed to using a walker at times and could also ambulate short distances without walker and or wheel chair. He was monitored closely for any potential problems that may arise as a result of and or  during detoxification treatment. Patient tolerated his treatment regimen and detoxification treatment without any significant adverse effects and or reactions.  Besides the detoxification treatment received while in this hospital, Daniel Gray also was ordered and received Prozac 20 mg daily  for depression, Gabapentin 200 mg tid for anxiety/pain management, Rozerem 8 mg for sleep and Naltrxone 50 mg daily for alcoholism. Patient attended treatment team meeting this am and met with the treatment team staff. His reason for admission, present symptoms, substance abuse issues, response to treatment and discharge plans discussed. Patient endorsed that he is doing well and stable for discharge to pursue the next phase of his substance abuse treatment. It was then decided and agreed upon that he will continue psychiatric/substance abuse treatment at [facility] [place]. However, patient will be discharge to his home today and will be in contact with [facility] to determine date to start treatment  Upon discharge, patient adamantly denies suicidal, homicidal ideations, auditory, visual hallucinations, delusional thoughts and or withdrawal symptoms. Patient left Daniel Gray with all personal belongings in no apparent distress. He received 4 days worth supply samples of his [facility] discharge medications. Transportation per sister.   Consults:  psychiatry  Significant Diagnostic Studies:  labs: CBC with diff, CMP, UDS, Toxicology tests, U/A  Discharge Vitals:   Blood pressure 120/81, pulse 81, temperature 97.3 F (36.3 C), temperature source Oral, resp. rate 18, height 6\' 1"  (1.854 m), weight 85 kg (187 lb 6.3 oz). Body mass index is 24.73 kg/(m^2). Lab Results:   Results for orders placed during the hospital encounter of 11/13/12 (from the past 72 hour(s))  COMPREHENSIVE METABOLIC PANEL     Status: Abnormal   Collection Time    11/25/12  8:00 PM      Result Value Range   Sodium 135  135 - 145 mEq/L   Potassium 4.4  3.5 - 5.1 mEq/L   Chloride 105  96 - 112 mEq/L   CO2 25  19 - 32 mEq/L   Glucose, Bld 141 (*) 70 - 99 mg/dL   BUN 10  6 - 23 mg/dL   Creatinine, Ser 4.09  0.50 - 1.35 mg/dL   Calcium 8.7  8.4 - 81.1 mg/dL   Total Protein 6.2  6.0 - 8.3 g/dL   Albumin  2.4 (*) 3.5 - 5.2 g/dL   AST 88 (*) 0 - 37 U/L   ALT 78 (*) 0 - 53 U/L   Alkaline Phosphatase 63  39 - 117 U/L   Total Bilirubin 0.5  0.3 - 1.2 mg/dL   GFR calc non Af Amer >90  >90 mL/min   GFR calc Af Amer >90  >90 mL/min   Comment: (NOTE)     The eGFR has been calculated using the CKD EPI equation.     This calculation has not been validated in all clinical situations.     eGFR's persistently <90 mL/min signify possible Chronic Kidney     Disease.     Performed at [facility]    Physical Findings: AIMS: Facial and Oral Movements Muscles of Facial Expression: None, normal Lips and Perioral Area: None, normal Jaw: None, normal Tongue: None, normal,Extremity Movements Upper (arms, wrists, hands, fingers): None, normal Lower (legs, knees, ankles, toes): None, normal, Trunk Movements Neck, shoulders, hips: None, normal, Overall Severity Severity of abnormal movements (highest score from questions above): None, normal Incapacitation due to abnormal movements: None, normal Patient's awareness of abnormal movements (rate only patient's report): No Awareness, Dental  Status Current problems with teeth and/or dentures?: No Does patient usually wear dentures?: No  CIWA:  CIWA-Ar Total: 0 COWS:  COWS Total Score: 0  Psychiatric Specialty Exam: See Psychiatric Specialty Exam and Suicide Risk Assessment completed by Attending Physician prior to discharge.  Discharge destination:  Other:  [facility] in [place], [place].  Is patient on multiple antipsychotic therapies at discharge:  No   Has Patient had three or more failed trials of antipsychotic monotherapy by history:  No  Recommended Plan for Multiple Antipsychotic Therapies: NA     Medication List    STOP taking these medications       ALPRAZolam 0.5 MG tablet  Commonly known as:  XANAX     doxepin 10 MG capsule  Commonly known as:  SINEQUAN     haloperidol 1 MG tablet  Commonly  known as:  HALDOL     sertraline 100 MG tablet  Commonly known as:  ZOLOFT      TAKE these medications     Indication   FLUoxetine 20 MG capsule  Commonly known as:  PROZAC  Take 1 capsule (20 mg total) by mouth daily. For depression   Indication:  Major Depressive Disorder     gabapentin 100 MG capsule  Commonly known as:  NEURONTIN  Take 2 capsules (200 mg total) by mouth 3 (three) times daily. For anxiety/pain managment   Indication:  Pain, Anxiety     lidocaine 5 %  Commonly known as:  LIDODERM  Place 1 patch onto the skin daily. Remove & Discard patch within 12 hours or as directed by MD: For pain management   Indication:  Pain management     naltrexone 50 MG tablet  Commonly known as:  DEPADE  Take 0.5 tablets (25 mg total) by mouth daily. For opioid addiction, alcoholism   Indication:  Excessive Use of Alcohol, Opioid Dependence     ramelteon 8 MG tablet  Commonly known as:  ROZEREM  Take 1 tablet (8 mg total) by mouth at bedtime. For sleep   Indication:  Trouble Sleeping       Follow-up Information   Follow up with [facility]. (Take bus to [facility]. (bus pass will be given to you at discharge). Call [facility] daily to keep in touch with them until departure date. )    Contact information:   [address] [place], [place] [address] Phone: [phone] Fax: [phone]     Follow-up recommendations:  Activity:  As tolerated Diet: As recommended by your primary care doctor. Keep all scheduled follow-up appointments as recommended.  Comments: Take all your medications as prescribed by your mental healthcare provider. Report any adverse effects and or reactions from your medicines to your outpatient provider promptly. Patient is instructed and cautioned to not engage in alcohol and or illegal drug use while on prescription medicines. In the event of worsening symptoms, patient is instructed to call the crisis  hotline, 911 and or go to the nearest ED for appropriate evaluation and treatment of symptoms. Follow-up with your primary care provider for your other medical issues, concerns and or health care needs.     Total Discharge Time:  Greater than 30 minutes.  SignedSanjuana Kava, PMHNP-BC 11/27/2012, 2:32 PM

## 2012-11-27 NOTE — BHH Group Notes (Signed)
[facility] LCSW Aftercare Discharge Planning Group Note   11/27/2012 9:12 AM  Participation Quality: Appropriate    Mood/Affect:  Appropriate  Depression Rating:  2  Anxiety Rating:  0  Thoughts of Suicide:  No Will you contract for safety?   NA  Current AVH:  No  Plan for Discharge/Comments:  Pt stated that he is ready to d/c today and is excited to go to [facility] in [place]. CSW explained bus process to patient and reminded him that his ticket is in his chart for Monday 8/25. Pt arranged with [facility] to go home for a few days in order to "get my arrangements in order."   Transportation Means: sister  Supports: sister/some family   Smart, Herbert Seta

## 2012-12-02 NOTE — Clinical Social Work Note (Signed)
[facility] called asking for d/c summary and meds. They did not receive fax when pt discharged from [facility]. Faxed over paperwork and completed quick disclosure at 3:10PM 12/02/12.

## 2012-12-02 NOTE — [facility] Notes (Signed)
Patient Discharge Instructions:  After Visit Summary (AVS):   Faxed to:  12/02/12 Discharge Summary Note:   Faxed to:  12/02/12 Psychiatric Admission Assessment Note:   Faxed to:  12/02/12 Suicide Risk Assessment - Discharge Assessment:   Faxed to:  12/02/12 Faxed/Sent to the Next Level Care provider:  12/02/12 Faxed to [facility] @ [phone]  Jerelene Redden, 12/02/2012, 4:06 PM

## 2012-12-04 DIAGNOSIS — R569 Unspecified convulsions: Secondary | ICD-10-CM | POA: Diagnosis not present

## 2012-12-04 DIAGNOSIS — M545 Low back pain: Secondary | ICD-10-CM | POA: Diagnosis not present

## 2012-12-04 DIAGNOSIS — F332 Major depressive disorder, recurrent severe without psychotic features: Secondary | ICD-10-CM | POA: Diagnosis not present

## 2012-12-04 DIAGNOSIS — G4700 Insomnia, unspecified: Secondary | ICD-10-CM | POA: Diagnosis not present

## 2012-12-04 NOTE — Discharge Summary (Signed)
Agree with assessment and pla Madie Reno A. Huxley.D.

## 2012-12-05 DIAGNOSIS — F332 Major depressive disorder, recurrent severe without psychotic features: Secondary | ICD-10-CM | POA: Diagnosis not present

## 2012-12-06 DIAGNOSIS — F332 Major depressive disorder, recurrent severe without psychotic features: Secondary | ICD-10-CM | POA: Diagnosis not present

## 2012-12-07 DIAGNOSIS — F332 Major depressive disorder, recurrent severe without psychotic features: Secondary | ICD-10-CM | POA: Diagnosis not present

## 2012-12-08 DIAGNOSIS — F332 Major depressive disorder, recurrent severe without psychotic features: Secondary | ICD-10-CM | POA: Diagnosis not present

## 2012-12-10 DIAGNOSIS — F332 Major depressive disorder, recurrent severe without psychotic features: Secondary | ICD-10-CM | POA: Diagnosis not present

## 2012-12-11 DIAGNOSIS — F332 Major depressive disorder, recurrent severe without psychotic features: Secondary | ICD-10-CM | POA: Diagnosis not present

## 2012-12-12 DIAGNOSIS — F332 Major depressive disorder, recurrent severe without psychotic features: Secondary | ICD-10-CM | POA: Diagnosis not present

## 2012-12-13 DIAGNOSIS — F332 Major depressive disorder, recurrent severe without psychotic features: Secondary | ICD-10-CM | POA: Diagnosis not present

## 2012-12-14 DIAGNOSIS — F332 Major depressive disorder, recurrent severe without psychotic features: Secondary | ICD-10-CM | POA: Diagnosis not present

## 2012-12-15 DIAGNOSIS — F332 Major depressive disorder, recurrent severe without psychotic features: Secondary | ICD-10-CM | POA: Diagnosis not present

## 2012-12-16 DIAGNOSIS — F332 Major depressive disorder, recurrent severe without psychotic features: Secondary | ICD-10-CM | POA: Diagnosis not present

## 2012-12-17 DIAGNOSIS — F332 Major depressive disorder, recurrent severe without psychotic features: Secondary | ICD-10-CM | POA: Diagnosis not present

## 2012-12-18 DIAGNOSIS — M545 Low back pain: Secondary | ICD-10-CM | POA: Diagnosis not present

## 2012-12-18 DIAGNOSIS — F332 Major depressive disorder, recurrent severe without psychotic features: Secondary | ICD-10-CM | POA: Diagnosis not present

## 2012-12-18 DIAGNOSIS — G4700 Insomnia, unspecified: Secondary | ICD-10-CM | POA: Diagnosis not present

## 2012-12-18 DIAGNOSIS — R569 Unspecified convulsions: Secondary | ICD-10-CM | POA: Diagnosis not present

## 2012-12-19 DIAGNOSIS — F332 Major depressive disorder, recurrent severe without psychotic features: Secondary | ICD-10-CM | POA: Diagnosis not present

## 2012-12-20 DIAGNOSIS — F332 Major depressive disorder, recurrent severe without psychotic features: Secondary | ICD-10-CM | POA: Diagnosis not present

## 2012-12-21 DIAGNOSIS — F332 Major depressive disorder, recurrent severe without psychotic features: Secondary | ICD-10-CM | POA: Diagnosis not present

## 2012-12-22 DIAGNOSIS — F332 Major depressive disorder, recurrent severe without psychotic features: Secondary | ICD-10-CM | POA: Diagnosis not present

## 2012-12-23 DIAGNOSIS — F332 Major depressive disorder, recurrent severe without psychotic features: Secondary | ICD-10-CM | POA: Diagnosis not present

## 2012-12-24 DIAGNOSIS — F332 Major depressive disorder, recurrent severe without psychotic features: Secondary | ICD-10-CM | POA: Diagnosis not present

## 2012-12-25 DIAGNOSIS — F332 Major depressive disorder, recurrent severe without psychotic features: Secondary | ICD-10-CM | POA: Diagnosis not present

## 2012-12-26 DIAGNOSIS — F332 Major depressive disorder, recurrent severe without psychotic features: Secondary | ICD-10-CM | POA: Diagnosis not present

## 2012-12-27 DIAGNOSIS — F332 Major depressive disorder, recurrent severe without psychotic features: Secondary | ICD-10-CM | POA: Diagnosis not present

## 2012-12-28 DIAGNOSIS — F332 Major depressive disorder, recurrent severe without psychotic features: Secondary | ICD-10-CM | POA: Diagnosis not present

## 2012-12-29 DIAGNOSIS — F332 Major depressive disorder, recurrent severe without psychotic features: Secondary | ICD-10-CM | POA: Diagnosis not present

## 2012-12-30 DIAGNOSIS — F332 Major depressive disorder, recurrent severe without psychotic features: Secondary | ICD-10-CM | POA: Diagnosis not present

## 2012-12-31 DIAGNOSIS — F332 Major depressive disorder, recurrent severe without psychotic features: Secondary | ICD-10-CM | POA: Diagnosis not present

## 2013-01-01 DIAGNOSIS — F332 Major depressive disorder, recurrent severe without psychotic features: Secondary | ICD-10-CM | POA: Diagnosis not present

## 2013-01-02 DIAGNOSIS — F332 Major depressive disorder, recurrent severe without psychotic features: Secondary | ICD-10-CM | POA: Diagnosis not present

## 2013-01-03 DIAGNOSIS — F332 Major depressive disorder, recurrent severe without psychotic features: Secondary | ICD-10-CM | POA: Diagnosis not present

## 2013-01-04 DIAGNOSIS — F332 Major depressive disorder, recurrent severe without psychotic features: Secondary | ICD-10-CM | POA: Diagnosis not present

## 2013-01-05 DIAGNOSIS — F332 Major depressive disorder, recurrent severe without psychotic features: Secondary | ICD-10-CM | POA: Diagnosis not present

## 2013-01-06 DIAGNOSIS — F332 Major depressive disorder, recurrent severe without psychotic features: Secondary | ICD-10-CM | POA: Diagnosis not present

## 2013-01-07 DIAGNOSIS — F332 Major depressive disorder, recurrent severe without psychotic features: Secondary | ICD-10-CM | POA: Diagnosis not present

## 2013-01-08 DIAGNOSIS — G4700 Insomnia, unspecified: Secondary | ICD-10-CM | POA: Diagnosis not present

## 2013-01-08 DIAGNOSIS — R259 Unspecified abnormal involuntary movements: Secondary | ICD-10-CM | POA: Diagnosis not present

## 2013-01-08 DIAGNOSIS — F323 Major depressive disorder, single episode, severe with psychotic features: Secondary | ICD-10-CM | POA: Diagnosis not present

## 2013-01-09 DIAGNOSIS — F323 Major depressive disorder, single episode, severe with psychotic features: Secondary | ICD-10-CM | POA: Diagnosis not present

## 2013-01-10 DIAGNOSIS — D649 Anemia, unspecified: Secondary | ICD-10-CM | POA: Diagnosis not present

## 2013-01-10 DIAGNOSIS — F323 Major depressive disorder, single episode, severe with psychotic features: Secondary | ICD-10-CM | POA: Diagnosis not present

## 2013-01-11 DIAGNOSIS — K921 Melena: Secondary | ICD-10-CM | POA: Diagnosis not present

## 2013-01-11 DIAGNOSIS — F323 Major depressive disorder, single episode, severe with psychotic features: Secondary | ICD-10-CM | POA: Diagnosis not present

## 2013-01-12 DIAGNOSIS — F323 Major depressive disorder, single episode, severe with psychotic features: Secondary | ICD-10-CM | POA: Diagnosis not present

## 2013-01-13 DIAGNOSIS — F323 Major depressive disorder, single episode, severe with psychotic features: Secondary | ICD-10-CM | POA: Diagnosis not present

## 2013-01-14 DIAGNOSIS — F323 Major depressive disorder, single episode, severe with psychotic features: Secondary | ICD-10-CM | POA: Diagnosis not present

## 2013-01-15 DIAGNOSIS — F323 Major depressive disorder, single episode, severe with psychotic features: Secondary | ICD-10-CM | POA: Diagnosis not present

## 2013-01-16 DIAGNOSIS — F323 Major depressive disorder, single episode, severe with psychotic features: Secondary | ICD-10-CM | POA: Diagnosis not present

## 2013-01-17 DIAGNOSIS — D649 Anemia, unspecified: Secondary | ICD-10-CM | POA: Diagnosis not present

## 2013-01-17 DIAGNOSIS — F323 Major depressive disorder, single episode, severe with psychotic features: Secondary | ICD-10-CM | POA: Diagnosis not present

## 2013-01-17 DIAGNOSIS — Z79899 Other long term (current) drug therapy: Secondary | ICD-10-CM | POA: Diagnosis not present

## 2013-01-17 DIAGNOSIS — E039 Hypothyroidism, unspecified: Secondary | ICD-10-CM | POA: Diagnosis not present

## 2013-01-17 DIAGNOSIS — B182 Chronic viral hepatitis C: Secondary | ICD-10-CM | POA: Diagnosis not present

## 2013-01-17 DIAGNOSIS — R5382 Chronic fatigue, unspecified: Secondary | ICD-10-CM | POA: Diagnosis not present

## 2013-01-17 DIAGNOSIS — R05 Cough: Secondary | ICD-10-CM | POA: Diagnosis not present

## 2013-01-18 DIAGNOSIS — F323 Major depressive disorder, single episode, severe with psychotic features: Secondary | ICD-10-CM | POA: Diagnosis not present

## 2013-01-19 DIAGNOSIS — F323 Major depressive disorder, single episode, severe with psychotic features: Secondary | ICD-10-CM | POA: Diagnosis not present

## 2013-01-20 DIAGNOSIS — F323 Major depressive disorder, single episode, severe with psychotic features: Secondary | ICD-10-CM | POA: Diagnosis not present

## 2013-01-21 DIAGNOSIS — F323 Major depressive disorder, single episode, severe with psychotic features: Secondary | ICD-10-CM | POA: Diagnosis not present

## 2013-01-22 DIAGNOSIS — F323 Major depressive disorder, single episode, severe with psychotic features: Secondary | ICD-10-CM | POA: Diagnosis not present

## 2013-01-23 DIAGNOSIS — F323 Major depressive disorder, single episode, severe with psychotic features: Secondary | ICD-10-CM | POA: Diagnosis not present

## 2013-01-24 DIAGNOSIS — Z79899 Other long term (current) drug therapy: Secondary | ICD-10-CM | POA: Diagnosis not present

## 2013-01-24 DIAGNOSIS — F323 Major depressive disorder, single episode, severe with psychotic features: Secondary | ICD-10-CM | POA: Diagnosis not present

## 2013-01-25 DIAGNOSIS — F323 Major depressive disorder, single episode, severe with psychotic features: Secondary | ICD-10-CM | POA: Diagnosis not present

## 2013-01-26 DIAGNOSIS — F323 Major depressive disorder, single episode, severe with psychotic features: Secondary | ICD-10-CM | POA: Diagnosis not present

## 2013-01-27 DIAGNOSIS — F323 Major depressive disorder, single episode, severe with psychotic features: Secondary | ICD-10-CM | POA: Diagnosis not present

## 2013-01-28 DIAGNOSIS — F323 Major depressive disorder, single episode, severe with psychotic features: Secondary | ICD-10-CM | POA: Diagnosis not present

## 2013-01-29 DIAGNOSIS — F323 Major depressive disorder, single episode, severe with psychotic features: Secondary | ICD-10-CM | POA: Diagnosis not present

## 2013-01-30 DIAGNOSIS — F323 Major depressive disorder, single episode, severe with psychotic features: Secondary | ICD-10-CM | POA: Diagnosis not present

## 2013-01-30 DIAGNOSIS — F17200 Nicotine dependence, unspecified, uncomplicated: Secondary | ICD-10-CM | POA: Diagnosis not present

## 2013-01-30 DIAGNOSIS — D61818 Other pancytopenia: Secondary | ICD-10-CM | POA: Diagnosis not present

## 2013-01-30 DIAGNOSIS — R042 Hemoptysis: Secondary | ICD-10-CM | POA: Diagnosis not present

## 2013-01-31 DIAGNOSIS — F323 Major depressive disorder, single episode, severe with psychotic features: Secondary | ICD-10-CM | POA: Diagnosis not present

## 2013-01-31 DIAGNOSIS — Z79899 Other long term (current) drug therapy: Secondary | ICD-10-CM | POA: Diagnosis not present

## 2013-02-01 DIAGNOSIS — F323 Major depressive disorder, single episode, severe with psychotic features: Secondary | ICD-10-CM | POA: Diagnosis not present

## 2013-02-02 DIAGNOSIS — F323 Major depressive disorder, single episode, severe with psychotic features: Secondary | ICD-10-CM | POA: Diagnosis not present

## 2013-02-03 DIAGNOSIS — E8809 Other disorders of plasma-protein metabolism, not elsewhere classified: Secondary | ICD-10-CM | POA: Diagnosis not present

## 2013-02-03 DIAGNOSIS — F323 Major depressive disorder, single episode, severe with psychotic features: Secondary | ICD-10-CM | POA: Diagnosis not present

## 2013-02-03 DIAGNOSIS — D649 Anemia, unspecified: Secondary | ICD-10-CM | POA: Diagnosis not present

## 2013-02-03 DIAGNOSIS — K219 Gastro-esophageal reflux disease without esophagitis: Secondary | ICD-10-CM | POA: Diagnosis not present

## 2013-02-03 DIAGNOSIS — G4700 Insomnia, unspecified: Secondary | ICD-10-CM | POA: Diagnosis not present

## 2013-02-04 DIAGNOSIS — R109 Unspecified abdominal pain: Secondary | ICD-10-CM | POA: Diagnosis not present

## 2013-02-04 DIAGNOSIS — D61818 Other pancytopenia: Secondary | ICD-10-CM | POA: Diagnosis not present

## 2013-02-04 DIAGNOSIS — D689 Coagulation defect, unspecified: Secondary | ICD-10-CM | POA: Diagnosis not present

## 2013-02-04 DIAGNOSIS — D539 Nutritional anemia, unspecified: Secondary | ICD-10-CM | POA: Diagnosis not present

## 2013-02-04 DIAGNOSIS — D649 Anemia, unspecified: Secondary | ICD-10-CM | POA: Diagnosis not present

## 2013-02-04 DIAGNOSIS — F323 Major depressive disorder, single episode, severe with psychotic features: Secondary | ICD-10-CM | POA: Diagnosis not present

## 2013-02-05 DIAGNOSIS — F323 Major depressive disorder, single episode, severe with psychotic features: Secondary | ICD-10-CM | POA: Diagnosis not present

## 2013-02-06 DIAGNOSIS — F323 Major depressive disorder, single episode, severe with psychotic features: Secondary | ICD-10-CM | POA: Diagnosis not present

## 2013-02-07 DIAGNOSIS — E039 Hypothyroidism, unspecified: Secondary | ICD-10-CM | POA: Diagnosis not present

## 2013-02-07 DIAGNOSIS — B182 Chronic viral hepatitis C: Secondary | ICD-10-CM | POA: Diagnosis not present

## 2013-02-07 DIAGNOSIS — F29 Unspecified psychosis not due to a substance or known physiological condition: Secondary | ICD-10-CM | POA: Diagnosis not present

## 2013-02-07 DIAGNOSIS — D61818 Other pancytopenia: Secondary | ICD-10-CM | POA: Diagnosis not present

## 2013-02-07 DIAGNOSIS — R7989 Other specified abnormal findings of blood chemistry: Secondary | ICD-10-CM | POA: Diagnosis not present

## 2013-02-07 DIAGNOSIS — D689 Coagulation defect, unspecified: Secondary | ICD-10-CM | POA: Diagnosis not present

## 2013-02-08 DIAGNOSIS — F323 Major depressive disorder, single episode, severe with psychotic features: Secondary | ICD-10-CM | POA: Diagnosis not present

## 2013-02-09 DIAGNOSIS — F323 Major depressive disorder, single episode, severe with psychotic features: Secondary | ICD-10-CM | POA: Diagnosis not present

## 2013-02-10 DIAGNOSIS — F323 Major depressive disorder, single episode, severe with psychotic features: Secondary | ICD-10-CM | POA: Diagnosis not present

## 2013-02-11 DIAGNOSIS — B1920 Unspecified viral hepatitis C without hepatic coma: Secondary | ICD-10-CM | POA: Diagnosis not present

## 2013-02-11 DIAGNOSIS — F323 Major depressive disorder, single episode, severe with psychotic features: Secondary | ICD-10-CM | POA: Diagnosis not present

## 2013-02-12 DIAGNOSIS — F323 Major depressive disorder, single episode, severe with psychotic features: Secondary | ICD-10-CM | POA: Diagnosis not present

## 2013-02-13 DIAGNOSIS — F323 Major depressive disorder, single episode, severe with psychotic features: Secondary | ICD-10-CM | POA: Diagnosis not present

## 2013-02-14 DIAGNOSIS — F323 Major depressive disorder, single episode, severe with psychotic features: Secondary | ICD-10-CM | POA: Diagnosis not present

## 2013-02-15 DIAGNOSIS — F323 Major depressive disorder, single episode, severe with psychotic features: Secondary | ICD-10-CM | POA: Diagnosis not present

## 2013-02-16 DIAGNOSIS — F323 Major depressive disorder, single episode, severe with psychotic features: Secondary | ICD-10-CM | POA: Diagnosis not present

## 2013-02-17 DIAGNOSIS — F323 Major depressive disorder, single episode, severe with psychotic features: Secondary | ICD-10-CM | POA: Diagnosis not present

## 2013-02-17 DIAGNOSIS — R042 Hemoptysis: Secondary | ICD-10-CM | POA: Diagnosis not present

## 2013-02-17 DIAGNOSIS — F17200 Nicotine dependence, unspecified, uncomplicated: Secondary | ICD-10-CM | POA: Diagnosis not present

## 2013-02-17 DIAGNOSIS — J209 Acute bronchitis, unspecified: Secondary | ICD-10-CM | POA: Diagnosis not present

## 2013-02-17 DIAGNOSIS — J309 Allergic rhinitis, unspecified: Secondary | ICD-10-CM | POA: Diagnosis not present

## 2013-02-18 DIAGNOSIS — F323 Major depressive disorder, single episode, severe with psychotic features: Secondary | ICD-10-CM | POA: Diagnosis not present

## 2013-02-19 DIAGNOSIS — F323 Major depressive disorder, single episode, severe with psychotic features: Secondary | ICD-10-CM | POA: Diagnosis not present

## 2013-02-20 DIAGNOSIS — F323 Major depressive disorder, single episode, severe with psychotic features: Secondary | ICD-10-CM | POA: Diagnosis not present

## 2013-02-21 DIAGNOSIS — F323 Major depressive disorder, single episode, severe with psychotic features: Secondary | ICD-10-CM | POA: Diagnosis not present

## 2013-02-22 DIAGNOSIS — F323 Major depressive disorder, single episode, severe with psychotic features: Secondary | ICD-10-CM | POA: Diagnosis not present

## 2013-02-23 DIAGNOSIS — F323 Major depressive disorder, single episode, severe with psychotic features: Secondary | ICD-10-CM | POA: Diagnosis not present

## 2013-02-24 DIAGNOSIS — F323 Major depressive disorder, single episode, severe with psychotic features: Secondary | ICD-10-CM | POA: Diagnosis not present

## 2013-02-25 DIAGNOSIS — F323 Major depressive disorder, single episode, severe with psychotic features: Secondary | ICD-10-CM | POA: Diagnosis not present

## 2013-02-26 DIAGNOSIS — F323 Major depressive disorder, single episode, severe with psychotic features: Secondary | ICD-10-CM | POA: Diagnosis not present

## 2013-02-27 DIAGNOSIS — F323 Major depressive disorder, single episode, severe with psychotic features: Secondary | ICD-10-CM | POA: Diagnosis not present

## 2013-02-28 DIAGNOSIS — F323 Major depressive disorder, single episode, severe with psychotic features: Secondary | ICD-10-CM | POA: Diagnosis not present

## 2013-03-01 DIAGNOSIS — F323 Major depressive disorder, single episode, severe with psychotic features: Secondary | ICD-10-CM | POA: Diagnosis not present

## 2013-03-02 DIAGNOSIS — F323 Major depressive disorder, single episode, severe with psychotic features: Secondary | ICD-10-CM | POA: Diagnosis not present

## 2013-03-03 DIAGNOSIS — K7290 Hepatic failure, unspecified without coma: Secondary | ICD-10-CM | POA: Diagnosis not present

## 2013-03-03 DIAGNOSIS — F05 Delirium due to known physiological condition: Secondary | ICD-10-CM | POA: Diagnosis not present

## 2013-03-03 DIAGNOSIS — Z79899 Other long term (current) drug therapy: Secondary | ICD-10-CM | POA: Diagnosis not present

## 2013-03-03 DIAGNOSIS — I495 Sick sinus syndrome: Secondary | ICD-10-CM | POA: Diagnosis not present

## 2013-03-03 DIAGNOSIS — F29 Unspecified psychosis not due to a substance or known physiological condition: Secondary | ICD-10-CM | POA: Diagnosis not present

## 2013-03-04 DIAGNOSIS — F323 Major depressive disorder, single episode, severe with psychotic features: Secondary | ICD-10-CM | POA: Diagnosis not present

## 2013-03-05 DIAGNOSIS — F323 Major depressive disorder, single episode, severe with psychotic features: Secondary | ICD-10-CM | POA: Diagnosis not present

## 2013-03-07 DIAGNOSIS — F323 Major depressive disorder, single episode, severe with psychotic features: Secondary | ICD-10-CM | POA: Diagnosis not present

## 2013-03-08 DIAGNOSIS — F323 Major depressive disorder, single episode, severe with psychotic features: Secondary | ICD-10-CM | POA: Diagnosis not present

## 2013-03-09 DIAGNOSIS — F323 Major depressive disorder, single episode, severe with psychotic features: Secondary | ICD-10-CM | POA: Diagnosis not present

## 2013-03-10 DIAGNOSIS — F323 Major depressive disorder, single episode, severe with psychotic features: Secondary | ICD-10-CM | POA: Diagnosis not present

## 2013-03-13 DIAGNOSIS — F323 Major depressive disorder, single episode, severe with psychotic features: Secondary | ICD-10-CM | POA: Diagnosis not present

## 2013-03-14 DIAGNOSIS — F323 Major depressive disorder, single episode, severe with psychotic features: Secondary | ICD-10-CM | POA: Diagnosis not present

## 2013-03-15 DIAGNOSIS — F323 Major depressive disorder, single episode, severe with psychotic features: Secondary | ICD-10-CM | POA: Diagnosis not present

## 2013-03-16 DIAGNOSIS — F323 Major depressive disorder, single episode, severe with psychotic features: Secondary | ICD-10-CM | POA: Diagnosis not present

## 2013-03-17 DIAGNOSIS — F323 Major depressive disorder, single episode, severe with psychotic features: Secondary | ICD-10-CM | POA: Diagnosis not present

## 2013-03-18 DIAGNOSIS — F323 Major depressive disorder, single episode, severe with psychotic features: Secondary | ICD-10-CM | POA: Diagnosis not present

## 2013-03-19 DIAGNOSIS — K7290 Hepatic failure, unspecified without coma: Secondary | ICD-10-CM | POA: Diagnosis not present

## 2013-03-19 DIAGNOSIS — B1920 Unspecified viral hepatitis C without hepatic coma: Secondary | ICD-10-CM | POA: Diagnosis not present

## 2013-03-19 DIAGNOSIS — R188 Other ascites: Secondary | ICD-10-CM | POA: Diagnosis not present

## 2013-03-19 DIAGNOSIS — F323 Major depressive disorder, single episode, severe with psychotic features: Secondary | ICD-10-CM | POA: Diagnosis not present

## 2013-03-19 DIAGNOSIS — K7460 Unspecified cirrhosis of liver: Secondary | ICD-10-CM | POA: Diagnosis not present

## 2013-03-20 DIAGNOSIS — F323 Major depressive disorder, single episode, severe with psychotic features: Secondary | ICD-10-CM | POA: Diagnosis not present

## 2013-03-21 DIAGNOSIS — K7290 Hepatic failure, unspecified without coma: Secondary | ICD-10-CM | POA: Diagnosis not present

## 2013-03-21 DIAGNOSIS — F17200 Nicotine dependence, unspecified, uncomplicated: Secondary | ICD-10-CM | POA: Diagnosis not present

## 2013-03-21 DIAGNOSIS — R4182 Altered mental status, unspecified: Secondary | ICD-10-CM | POA: Diagnosis not present

## 2013-03-24 DIAGNOSIS — B1920 Unspecified viral hepatitis C without hepatic coma: Secondary | ICD-10-CM | POA: Diagnosis not present

## 2013-03-24 DIAGNOSIS — R5381 Other malaise: Secondary | ICD-10-CM | POA: Diagnosis not present

## 2013-03-24 DIAGNOSIS — R5383 Other fatigue: Secondary | ICD-10-CM | POA: Diagnosis not present

## 2013-03-24 DIAGNOSIS — R079 Chest pain, unspecified: Secondary | ICD-10-CM | POA: Diagnosis not present

## 2013-03-24 DIAGNOSIS — K7460 Unspecified cirrhosis of liver: Secondary | ICD-10-CM | POA: Diagnosis not present

## 2013-03-24 DIAGNOSIS — H9190 Unspecified hearing loss, unspecified ear: Secondary | ICD-10-CM | POA: Diagnosis not present

## 2013-03-24 DIAGNOSIS — S0990XA Unspecified injury of head, initial encounter: Secondary | ICD-10-CM | POA: Diagnosis not present

## 2013-03-24 DIAGNOSIS — D696 Thrombocytopenia, unspecified: Secondary | ICD-10-CM | POA: Diagnosis not present

## 2013-03-26 DIAGNOSIS — M549 Dorsalgia, unspecified: Secondary | ICD-10-CM | POA: Diagnosis not present

## 2013-03-26 DIAGNOSIS — F323 Major depressive disorder, single episode, severe with psychotic features: Secondary | ICD-10-CM | POA: Diagnosis not present

## 2013-03-26 DIAGNOSIS — B1920 Unspecified viral hepatitis C without hepatic coma: Secondary | ICD-10-CM | POA: Diagnosis not present

## 2013-03-27 DIAGNOSIS — F323 Major depressive disorder, single episode, severe with psychotic features: Secondary | ICD-10-CM | POA: Diagnosis not present

## 2013-03-28 DIAGNOSIS — F323 Major depressive disorder, single episode, severe with psychotic features: Secondary | ICD-10-CM | POA: Diagnosis not present

## 2013-03-29 DIAGNOSIS — F323 Major depressive disorder, single episode, severe with psychotic features: Secondary | ICD-10-CM | POA: Diagnosis not present

## 2013-03-30 DIAGNOSIS — F323 Major depressive disorder, single episode, severe with psychotic features: Secondary | ICD-10-CM | POA: Diagnosis not present

## 2013-03-31 DIAGNOSIS — F323 Major depressive disorder, single episode, severe with psychotic features: Secondary | ICD-10-CM | POA: Diagnosis not present

## 2013-04-01 DIAGNOSIS — F323 Major depressive disorder, single episode, severe with psychotic features: Secondary | ICD-10-CM | POA: Diagnosis not present

## 2013-04-02 DIAGNOSIS — F323 Major depressive disorder, single episode, severe with psychotic features: Secondary | ICD-10-CM | POA: Diagnosis not present

## 2013-04-04 DIAGNOSIS — F323 Major depressive disorder, single episode, severe with psychotic features: Secondary | ICD-10-CM | POA: Diagnosis not present

## 2013-04-05 DIAGNOSIS — F323 Major depressive disorder, single episode, severe with psychotic features: Secondary | ICD-10-CM | POA: Diagnosis not present

## 2013-04-07 DIAGNOSIS — F323 Major depressive disorder, single episode, severe with psychotic features: Secondary | ICD-10-CM | POA: Diagnosis not present

## 2013-04-08 DIAGNOSIS — F323 Major depressive disorder, single episode, severe with psychotic features: Secondary | ICD-10-CM | POA: Diagnosis not present

## 2013-04-09 DIAGNOSIS — Z8659 Personal history of other mental and behavioral disorders: Secondary | ICD-10-CM | POA: Diagnosis not present

## 2013-04-09 DIAGNOSIS — K769 Liver disease, unspecified: Secondary | ICD-10-CM | POA: Diagnosis not present

## 2013-04-09 DIAGNOSIS — F489 Nonpsychotic mental disorder, unspecified: Secondary | ICD-10-CM | POA: Diagnosis not present

## 2013-04-09 DIAGNOSIS — M545 Low back pain: Secondary | ICD-10-CM | POA: Diagnosis not present

## 2013-04-09 DIAGNOSIS — K7290 Hepatic failure, unspecified without coma: Secondary | ICD-10-CM | POA: Diagnosis not present

## 2013-04-10 DIAGNOSIS — F332 Major depressive disorder, recurrent severe without psychotic features: Secondary | ICD-10-CM | POA: Diagnosis not present

## 2013-04-11 DIAGNOSIS — F332 Major depressive disorder, recurrent severe without psychotic features: Secondary | ICD-10-CM | POA: Diagnosis not present

## 2013-04-14 DIAGNOSIS — F332 Major depressive disorder, recurrent severe without psychotic features: Secondary | ICD-10-CM | POA: Diagnosis not present

## 2013-04-15 DIAGNOSIS — D684 Acquired coagulation factor deficiency: Secondary | ICD-10-CM | POA: Diagnosis not present

## 2013-04-15 DIAGNOSIS — F332 Major depressive disorder, recurrent severe without psychotic features: Secondary | ICD-10-CM | POA: Diagnosis not present

## 2013-04-15 DIAGNOSIS — D61818 Other pancytopenia: Secondary | ICD-10-CM | POA: Diagnosis not present

## 2013-04-16 DIAGNOSIS — F332 Major depressive disorder, recurrent severe without psychotic features: Secondary | ICD-10-CM | POA: Diagnosis not present

## 2013-04-17 DIAGNOSIS — K7682 Hepatic encephalopathy: Secondary | ICD-10-CM | POA: Diagnosis not present

## 2013-04-17 DIAGNOSIS — K7290 Hepatic failure, unspecified without coma: Secondary | ICD-10-CM | POA: Diagnosis not present

## 2013-04-17 DIAGNOSIS — D689 Coagulation defect, unspecified: Secondary | ICD-10-CM | POA: Diagnosis not present

## 2013-04-17 DIAGNOSIS — F4310 Post-traumatic stress disorder, unspecified: Secondary | ICD-10-CM | POA: Diagnosis not present

## 2013-04-17 DIAGNOSIS — D696 Thrombocytopenia, unspecified: Secondary | ICD-10-CM | POA: Diagnosis not present

## 2013-04-18 DIAGNOSIS — F05 Delirium due to known physiological condition: Secondary | ICD-10-CM | POA: Diagnosis not present

## 2013-04-18 DIAGNOSIS — F4310 Post-traumatic stress disorder, unspecified: Secondary | ICD-10-CM | POA: Diagnosis not present

## 2013-04-18 DIAGNOSIS — D689 Coagulation defect, unspecified: Secondary | ICD-10-CM | POA: Diagnosis not present

## 2013-04-18 DIAGNOSIS — K7682 Hepatic encephalopathy: Secondary | ICD-10-CM | POA: Diagnosis not present

## 2013-04-18 DIAGNOSIS — D696 Thrombocytopenia, unspecified: Secondary | ICD-10-CM | POA: Diagnosis not present

## 2013-04-18 DIAGNOSIS — K7290 Hepatic failure, unspecified without coma: Secondary | ICD-10-CM | POA: Diagnosis not present

## 2013-04-19 DIAGNOSIS — K7290 Hepatic failure, unspecified without coma: Secondary | ICD-10-CM | POA: Diagnosis not present

## 2013-04-19 DIAGNOSIS — K7682 Hepatic encephalopathy: Secondary | ICD-10-CM | POA: Diagnosis not present

## 2013-04-19 DIAGNOSIS — D689 Coagulation defect, unspecified: Secondary | ICD-10-CM | POA: Diagnosis not present

## 2013-04-19 DIAGNOSIS — D696 Thrombocytopenia, unspecified: Secondary | ICD-10-CM | POA: Diagnosis not present

## 2013-04-19 DIAGNOSIS — F4310 Post-traumatic stress disorder, unspecified: Secondary | ICD-10-CM | POA: Diagnosis not present

## 2013-04-20 DIAGNOSIS — D696 Thrombocytopenia, unspecified: Secondary | ICD-10-CM | POA: Diagnosis not present

## 2013-04-20 DIAGNOSIS — K7682 Hepatic encephalopathy: Secondary | ICD-10-CM | POA: Diagnosis not present

## 2013-04-20 DIAGNOSIS — D689 Coagulation defect, unspecified: Secondary | ICD-10-CM | POA: Diagnosis not present

## 2013-04-20 DIAGNOSIS — K7290 Hepatic failure, unspecified without coma: Secondary | ICD-10-CM | POA: Diagnosis not present

## 2013-04-20 DIAGNOSIS — F4310 Post-traumatic stress disorder, unspecified: Secondary | ICD-10-CM | POA: Diagnosis not present

## 2013-04-21 DIAGNOSIS — F332 Major depressive disorder, recurrent severe without psychotic features: Secondary | ICD-10-CM | POA: Diagnosis not present

## 2013-04-22 DIAGNOSIS — F332 Major depressive disorder, recurrent severe without psychotic features: Secondary | ICD-10-CM | POA: Diagnosis not present

## 2013-04-23 DIAGNOSIS — F332 Major depressive disorder, recurrent severe without psychotic features: Secondary | ICD-10-CM | POA: Diagnosis not present

## 2013-04-24 DIAGNOSIS — B182 Chronic viral hepatitis C: Secondary | ICD-10-CM | POA: Diagnosis not present

## 2013-04-24 DIAGNOSIS — Z8249 Family history of ischemic heart disease and other diseases of the circulatory system: Secondary | ICD-10-CM | POA: Diagnosis not present

## 2013-04-24 DIAGNOSIS — K7460 Unspecified cirrhosis of liver: Secondary | ICD-10-CM | POA: Diagnosis not present

## 2013-04-24 DIAGNOSIS — F17200 Nicotine dependence, unspecified, uncomplicated: Secondary | ICD-10-CM | POA: Diagnosis not present

## 2013-04-24 DIAGNOSIS — Z125 Encounter for screening for malignant neoplasm of prostate: Secondary | ICD-10-CM | POA: Diagnosis not present

## 2013-04-24 DIAGNOSIS — F3130 Bipolar disorder, current episode depressed, mild or moderate severity, unspecified: Secondary | ICD-10-CM | POA: Diagnosis not present

## 2013-04-24 DIAGNOSIS — F332 Major depressive disorder, recurrent severe without psychotic features: Secondary | ICD-10-CM | POA: Diagnosis not present

## 2013-04-24 DIAGNOSIS — Z13220 Encounter for screening for lipoid disorders: Secondary | ICD-10-CM | POA: Diagnosis not present

## 2013-04-25 DIAGNOSIS — D696 Thrombocytopenia, unspecified: Secondary | ICD-10-CM | POA: Diagnosis not present

## 2013-04-25 DIAGNOSIS — F332 Major depressive disorder, recurrent severe without psychotic features: Secondary | ICD-10-CM | POA: Diagnosis not present

## 2013-04-25 DIAGNOSIS — D649 Anemia, unspecified: Secondary | ICD-10-CM | POA: Diagnosis not present

## 2013-04-25 DIAGNOSIS — D72819 Decreased white blood cell count, unspecified: Secondary | ICD-10-CM | POA: Diagnosis not present

## 2013-04-25 DIAGNOSIS — E7220 Disorder of urea cycle metabolism, unspecified: Secondary | ICD-10-CM | POA: Diagnosis not present

## 2013-04-25 DIAGNOSIS — K769 Liver disease, unspecified: Secondary | ICD-10-CM | POA: Diagnosis not present

## 2013-04-26 DIAGNOSIS — F332 Major depressive disorder, recurrent severe without psychotic features: Secondary | ICD-10-CM | POA: Diagnosis not present

## 2013-04-27 DIAGNOSIS — F332 Major depressive disorder, recurrent severe without psychotic features: Secondary | ICD-10-CM | POA: Diagnosis not present

## 2013-04-28 DIAGNOSIS — F332 Major depressive disorder, recurrent severe without psychotic features: Secondary | ICD-10-CM | POA: Diagnosis not present

## 2013-04-29 DIAGNOSIS — F332 Major depressive disorder, recurrent severe without psychotic features: Secondary | ICD-10-CM | POA: Diagnosis not present

## 2013-04-30 DIAGNOSIS — F332 Major depressive disorder, recurrent severe without psychotic features: Secondary | ICD-10-CM | POA: Diagnosis not present

## 2013-05-01 DIAGNOSIS — F332 Major depressive disorder, recurrent severe without psychotic features: Secondary | ICD-10-CM | POA: Diagnosis not present

## 2013-05-02 DIAGNOSIS — F332 Major depressive disorder, recurrent severe without psychotic features: Secondary | ICD-10-CM | POA: Diagnosis not present

## 2013-05-03 DIAGNOSIS — F332 Major depressive disorder, recurrent severe without psychotic features: Secondary | ICD-10-CM | POA: Diagnosis not present

## 2013-05-04 DIAGNOSIS — F332 Major depressive disorder, recurrent severe without psychotic features: Secondary | ICD-10-CM | POA: Diagnosis not present

## 2013-05-05 DIAGNOSIS — F332 Major depressive disorder, recurrent severe without psychotic features: Secondary | ICD-10-CM | POA: Diagnosis not present

## 2013-05-06 DIAGNOSIS — F332 Major depressive disorder, recurrent severe without psychotic features: Secondary | ICD-10-CM | POA: Diagnosis not present

## 2013-05-07 DIAGNOSIS — Z1211 Encounter for screening for malignant neoplasm of colon: Secondary | ICD-10-CM | POA: Diagnosis not present

## 2013-05-07 DIAGNOSIS — K7460 Unspecified cirrhosis of liver: Secondary | ICD-10-CM | POA: Diagnosis not present

## 2013-05-07 DIAGNOSIS — F332 Major depressive disorder, recurrent severe without psychotic features: Secondary | ICD-10-CM | POA: Diagnosis not present

## 2013-05-07 DIAGNOSIS — B1920 Unspecified viral hepatitis C without hepatic coma: Secondary | ICD-10-CM | POA: Diagnosis not present

## 2013-05-08 DIAGNOSIS — F332 Major depressive disorder, recurrent severe without psychotic features: Secondary | ICD-10-CM | POA: Diagnosis not present

## 2013-05-09 DIAGNOSIS — F332 Major depressive disorder, recurrent severe without psychotic features: Secondary | ICD-10-CM | POA: Diagnosis not present

## 2013-05-10 DIAGNOSIS — F332 Major depressive disorder, recurrent severe without psychotic features: Secondary | ICD-10-CM | POA: Diagnosis not present

## 2013-05-11 DIAGNOSIS — F332 Major depressive disorder, recurrent severe without psychotic features: Secondary | ICD-10-CM | POA: Diagnosis not present

## 2013-05-12 DIAGNOSIS — F332 Major depressive disorder, recurrent severe without psychotic features: Secondary | ICD-10-CM | POA: Diagnosis not present

## 2013-05-13 DIAGNOSIS — F332 Major depressive disorder, recurrent severe without psychotic features: Secondary | ICD-10-CM | POA: Diagnosis not present

## 2013-05-14 DIAGNOSIS — F332 Major depressive disorder, recurrent severe without psychotic features: Secondary | ICD-10-CM | POA: Diagnosis not present

## 2013-05-15 DIAGNOSIS — F332 Major depressive disorder, recurrent severe without psychotic features: Secondary | ICD-10-CM | POA: Diagnosis not present

## 2013-05-16 DIAGNOSIS — F332 Major depressive disorder, recurrent severe without psychotic features: Secondary | ICD-10-CM | POA: Diagnosis not present

## 2013-05-19 DIAGNOSIS — K7460 Unspecified cirrhosis of liver: Secondary | ICD-10-CM | POA: Diagnosis not present

## 2013-05-19 DIAGNOSIS — K7682 Hepatic encephalopathy: Secondary | ICD-10-CM | POA: Diagnosis not present

## 2013-05-19 DIAGNOSIS — K7290 Hepatic failure, unspecified without coma: Secondary | ICD-10-CM | POA: Diagnosis not present

## 2013-05-19 DIAGNOSIS — F17200 Nicotine dependence, unspecified, uncomplicated: Secondary | ICD-10-CM | POA: Diagnosis not present

## 2013-05-31 DIAGNOSIS — L259 Unspecified contact dermatitis, unspecified cause: Secondary | ICD-10-CM | POA: Diagnosis not present

## 2013-05-31 DIAGNOSIS — L298 Other pruritus: Secondary | ICD-10-CM | POA: Diagnosis not present

## 2013-05-31 DIAGNOSIS — K7460 Unspecified cirrhosis of liver: Secondary | ICD-10-CM | POA: Diagnosis not present

## 2013-06-11 DIAGNOSIS — K7460 Unspecified cirrhosis of liver: Secondary | ICD-10-CM | POA: Diagnosis not present

## 2013-06-12 DIAGNOSIS — F3289 Other specified depressive episodes: Secondary | ICD-10-CM | POA: Diagnosis not present

## 2013-06-12 DIAGNOSIS — F329 Major depressive disorder, single episode, unspecified: Secondary | ICD-10-CM | POA: Diagnosis not present

## 2013-06-20 DIAGNOSIS — K7460 Unspecified cirrhosis of liver: Secondary | ICD-10-CM | POA: Diagnosis not present

## 2013-06-23 DIAGNOSIS — G9340 Encephalopathy, unspecified: Secondary | ICD-10-CM | POA: Diagnosis not present

## 2013-06-23 DIAGNOSIS — K7460 Unspecified cirrhosis of liver: Secondary | ICD-10-CM | POA: Diagnosis not present

## 2013-06-23 DIAGNOSIS — R4182 Altered mental status, unspecified: Secondary | ICD-10-CM | POA: Diagnosis not present

## 2013-06-23 DIAGNOSIS — E7220 Disorder of urea cycle metabolism, unspecified: Secondary | ICD-10-CM | POA: Diagnosis not present

## 2013-07-02 DIAGNOSIS — K7682 Hepatic encephalopathy: Secondary | ICD-10-CM | POA: Diagnosis not present

## 2013-07-02 DIAGNOSIS — K7290 Hepatic failure, unspecified without coma: Secondary | ICD-10-CM | POA: Diagnosis not present

## 2013-07-02 DIAGNOSIS — K7460 Unspecified cirrhosis of liver: Secondary | ICD-10-CM | POA: Diagnosis not present

## 2013-08-06 DIAGNOSIS — K7290 Hepatic failure, unspecified without coma: Secondary | ICD-10-CM | POA: Diagnosis not present

## 2013-08-06 DIAGNOSIS — Z23 Encounter for immunization: Secondary | ICD-10-CM | POA: Diagnosis not present

## 2013-08-06 DIAGNOSIS — K7460 Unspecified cirrhosis of liver: Secondary | ICD-10-CM | POA: Diagnosis not present

## 2013-08-06 DIAGNOSIS — K7682 Hepatic encephalopathy: Secondary | ICD-10-CM | POA: Diagnosis not present

## 2013-08-12 DIAGNOSIS — F17200 Nicotine dependence, unspecified, uncomplicated: Secondary | ICD-10-CM | POA: Diagnosis not present

## 2013-08-12 DIAGNOSIS — R5383 Other fatigue: Secondary | ICD-10-CM | POA: Diagnosis not present

## 2013-08-12 DIAGNOSIS — F319 Bipolar disorder, unspecified: Secondary | ICD-10-CM | POA: Diagnosis not present

## 2013-08-12 DIAGNOSIS — E876 Hypokalemia: Secondary | ICD-10-CM | POA: Diagnosis not present

## 2013-08-12 DIAGNOSIS — K219 Gastro-esophageal reflux disease without esophagitis: Secondary | ICD-10-CM | POA: Diagnosis not present

## 2013-08-12 DIAGNOSIS — F411 Generalized anxiety disorder: Secondary | ICD-10-CM | POA: Diagnosis not present

## 2013-08-12 DIAGNOSIS — R5381 Other malaise: Secondary | ICD-10-CM | POA: Diagnosis not present

## 2013-09-08 DIAGNOSIS — R5383 Other fatigue: Secondary | ICD-10-CM | POA: Diagnosis not present

## 2013-09-08 DIAGNOSIS — R5381 Other malaise: Secondary | ICD-10-CM | POA: Diagnosis not present

## 2013-09-08 DIAGNOSIS — F70 Mild intellectual disabilities: Secondary | ICD-10-CM | POA: Diagnosis not present

## 2013-09-08 DIAGNOSIS — K7030 Alcoholic cirrhosis of liver without ascites: Secondary | ICD-10-CM | POA: Diagnosis not present

## 2013-10-15 DIAGNOSIS — K7290 Hepatic failure, unspecified without coma: Secondary | ICD-10-CM | POA: Diagnosis not present

## 2013-10-15 DIAGNOSIS — K7682 Hepatic encephalopathy: Secondary | ICD-10-CM | POA: Diagnosis not present

## 2013-10-15 DIAGNOSIS — K7460 Unspecified cirrhosis of liver: Secondary | ICD-10-CM | POA: Diagnosis not present

## 2013-11-09 DIAGNOSIS — K449 Diaphragmatic hernia without obstruction or gangrene: Secondary | ICD-10-CM | POA: Diagnosis not present

## 2013-11-09 DIAGNOSIS — E7220 Disorder of urea cycle metabolism, unspecified: Secondary | ICD-10-CM | POA: Diagnosis not present

## 2013-11-09 DIAGNOSIS — R4182 Altered mental status, unspecified: Secondary | ICD-10-CM | POA: Diagnosis not present

## 2013-11-09 DIAGNOSIS — K7030 Alcoholic cirrhosis of liver without ascites: Secondary | ICD-10-CM | POA: Diagnosis not present

## 2013-11-09 DIAGNOSIS — K7682 Hepatic encephalopathy: Secondary | ICD-10-CM | POA: Diagnosis not present

## 2013-11-09 DIAGNOSIS — F29 Unspecified psychosis not due to a substance or known physiological condition: Secondary | ICD-10-CM | POA: Diagnosis not present

## 2013-11-09 DIAGNOSIS — K769 Liver disease, unspecified: Secondary | ICD-10-CM | POA: Diagnosis not present

## 2013-11-09 DIAGNOSIS — F319 Bipolar disorder, unspecified: Secondary | ICD-10-CM | POA: Diagnosis not present

## 2013-11-09 DIAGNOSIS — K7290 Hepatic failure, unspecified without coma: Secondary | ICD-10-CM | POA: Diagnosis not present

## 2013-11-10 DIAGNOSIS — R4182 Altered mental status, unspecified: Secondary | ICD-10-CM | POA: Diagnosis not present

## 2013-11-10 DIAGNOSIS — K449 Diaphragmatic hernia without obstruction or gangrene: Secondary | ICD-10-CM | POA: Diagnosis not present

## 2013-11-10 DIAGNOSIS — F319 Bipolar disorder, unspecified: Secondary | ICD-10-CM | POA: Diagnosis not present

## 2013-11-10 DIAGNOSIS — K7290 Hepatic failure, unspecified without coma: Secondary | ICD-10-CM | POA: Diagnosis not present

## 2013-11-10 DIAGNOSIS — K7030 Alcoholic cirrhosis of liver without ascites: Secondary | ICD-10-CM | POA: Diagnosis not present

## 2013-11-10 DIAGNOSIS — K7682 Hepatic encephalopathy: Secondary | ICD-10-CM | POA: Diagnosis not present

## 2013-11-18 DIAGNOSIS — K7030 Alcoholic cirrhosis of liver without ascites: Secondary | ICD-10-CM | POA: Diagnosis not present

## 2014-01-14 DIAGNOSIS — K7460 Unspecified cirrhosis of liver: Secondary | ICD-10-CM | POA: Diagnosis not present

## 2014-01-14 DIAGNOSIS — K7290 Hepatic failure, unspecified without coma: Secondary | ICD-10-CM | POA: Diagnosis not present

## 2014-01-19 DIAGNOSIS — K7031 Alcoholic cirrhosis of liver with ascites: Secondary | ICD-10-CM | POA: Diagnosis not present

## 2014-01-22 DIAGNOSIS — K7030 Alcoholic cirrhosis of liver without ascites: Secondary | ICD-10-CM | POA: Diagnosis not present

## 2014-01-22 DIAGNOSIS — K7460 Unspecified cirrhosis of liver: Secondary | ICD-10-CM | POA: Diagnosis not present

## 2014-01-22 DIAGNOSIS — K7290 Hepatic failure, unspecified without coma: Secondary | ICD-10-CM | POA: Diagnosis not present

## 2014-01-23 DIAGNOSIS — K259 Gastric ulcer, unspecified as acute or chronic, without hemorrhage or perforation: Secondary | ICD-10-CM | POA: Diagnosis not present

## 2014-01-23 DIAGNOSIS — Z811 Family history of alcohol abuse and dependence: Secondary | ICD-10-CM | POA: Diagnosis not present

## 2014-01-23 DIAGNOSIS — Z79899 Other long term (current) drug therapy: Secondary | ICD-10-CM | POA: Diagnosis not present

## 2014-01-23 DIAGNOSIS — K219 Gastro-esophageal reflux disease without esophagitis: Secondary | ICD-10-CM | POA: Diagnosis not present

## 2014-01-23 DIAGNOSIS — F17200 Nicotine dependence, unspecified, uncomplicated: Secondary | ICD-10-CM | POA: Diagnosis not present

## 2014-01-23 DIAGNOSIS — K7290 Hepatic failure, unspecified without coma: Secondary | ICD-10-CM | POA: Diagnosis not present

## 2014-01-23 DIAGNOSIS — K3189 Other diseases of stomach and duodenum: Secondary | ICD-10-CM | POA: Diagnosis not present

## 2014-01-23 DIAGNOSIS — I8500 Esophageal varices without bleeding: Secondary | ICD-10-CM | POA: Diagnosis not present

## 2014-01-23 DIAGNOSIS — K7460 Unspecified cirrhosis of liver: Secondary | ICD-10-CM | POA: Diagnosis not present

## 2014-01-23 DIAGNOSIS — F419 Anxiety disorder, unspecified: Secondary | ICD-10-CM | POA: Diagnosis not present

## 2014-01-23 DIAGNOSIS — Z836 Family history of other diseases of the respiratory system: Secondary | ICD-10-CM | POA: Diagnosis not present

## 2014-01-23 DIAGNOSIS — G629 Polyneuropathy, unspecified: Secondary | ICD-10-CM | POA: Diagnosis not present

## 2014-01-23 DIAGNOSIS — K766 Portal hypertension: Secondary | ICD-10-CM | POA: Diagnosis not present

## 2014-01-23 DIAGNOSIS — I864 Gastric varices: Secondary | ICD-10-CM | POA: Diagnosis not present

## 2014-03-18 ENCOUNTER — Encounter (HOSPITAL_COMMUNITY): Payer: Self-pay | Admitting: *Deleted

## 2014-03-18 ENCOUNTER — Emergency Department (HOSPITAL_COMMUNITY)
Admission: EM | Admit: 2014-03-18 | Discharge: 2014-03-18 | Disposition: A | Discharge status: Home / Self Care | Payer: Medicare Other | Attending: Emergency Medicine | Admitting: Emergency Medicine

## 2014-03-18 DIAGNOSIS — Z720 Tobacco use: Secondary | ICD-10-CM | POA: Insufficient documentation

## 2014-03-18 DIAGNOSIS — F419 Anxiety disorder, unspecified: Secondary | ICD-10-CM | POA: Diagnosis not present

## 2014-03-18 DIAGNOSIS — G40909 Epilepsy, unspecified, not intractable, without status epilepticus: Secondary | ICD-10-CM | POA: Diagnosis not present

## 2014-03-18 DIAGNOSIS — Z79899 Other long term (current) drug therapy: Secondary | ICD-10-CM | POA: Diagnosis not present

## 2014-03-18 DIAGNOSIS — F1410 Cocaine abuse, uncomplicated: Secondary | ICD-10-CM | POA: Insufficient documentation

## 2014-03-18 DIAGNOSIS — F329 Major depressive disorder, single episode, unspecified: Secondary | ICD-10-CM | POA: Insufficient documentation

## 2014-03-18 LAB — CBC WITH DIFFERENTIAL/PLATELET
Basophils Absolute: 0 10*3/uL (ref 0.0–0.1)
Basophils Relative: 0 % (ref 0–1)
EOS PCT: 4 % (ref 0–5)
Eosinophils Absolute: 0.2 10*3/uL (ref 0.0–0.7)
HEMATOCRIT: 31.7 % — AB (ref 39.0–52.0)
HEMOGLOBIN: 11.2 g/dL — AB (ref 13.0–17.0)
LYMPHS PCT: 25 % (ref 12–46)
Lymphs Abs: 1.2 10*3/uL (ref 0.7–4.0)
MCH: 29.7 pg (ref 26.0–34.0)
MCHC: 35.3 g/dL (ref 30.0–36.0)
MCV: 84.1 fL (ref 78.0–100.0)
MONO ABS: 0.6 10*3/uL (ref 0.1–1.0)
MONOS PCT: 13 % — AB (ref 3–12)
NEUTROS ABS: 2.8 10*3/uL (ref 1.7–7.7)
Neutrophils Relative %: 58 % (ref 43–77)
Platelets: 71 10*3/uL — ABNORMAL LOW (ref 150–400)
RBC: 3.77 MIL/uL — ABNORMAL LOW (ref 4.22–5.81)
RDW: 17.4 % — ABNORMAL HIGH (ref 11.5–15.5)
Smear Review: DECREASED
WBC: 4.9 10*3/uL (ref 4.0–10.5)

## 2014-03-18 LAB — COMPREHENSIVE METABOLIC PANEL
ALBUMIN: 2.7 g/dL — AB (ref 3.5–5.2)
ALT: 29 U/L (ref 0–53)
ANION GAP: 10 (ref 5–15)
AST: 44 U/L — ABNORMAL HIGH (ref 0–37)
Alkaline Phosphatase: 102 U/L (ref 39–117)
BILIRUBIN TOTAL: 1.1 mg/dL (ref 0.3–1.2)
BUN: 6 mg/dL (ref 6–23)
CALCIUM: 8.5 mg/dL (ref 8.4–10.5)
CO2: 23 mEq/L (ref 19–32)
CREATININE: 0.95 mg/dL (ref 0.50–1.35)
Chloride: 108 mEq/L (ref 96–112)
GFR calc Af Amer: >90 mL/min (ref >90)
GFR calc non Af Amer: >90 mL/min (ref >90)
Glucose, Bld: 123 mg/dL — ABNORMAL HIGH (ref 70–99)
Potassium: 4.1 mEq/L (ref 3.7–5.3)
Sodium: 141 mEq/L (ref 137–147)
TOTAL PROTEIN: 6.5 g/dL (ref 6.0–8.3)

## 2014-03-18 LAB — AMMONIA: AMMONIA: 108 umol/L — AB (ref 11–60)

## 2014-03-18 LAB — RAPID URINE DRUG SCREEN, HOSP PERFORMED
AMPHETAMINES: NOT DETECTED
BARBITURATES: NOT DETECTED
BENZODIAZEPINES: NOT DETECTED
COCAINE: POSITIVE — AB
OPIATES: NOT DETECTED
TETRAHYDROCANNABINOL: POSITIVE — AB

## 2014-03-18 LAB — ETHANOL: Alcohol, Ethyl (B): <11 mg/dL (ref 0–11)

## 2014-03-18 NOTE — ED Notes (Signed)
Telepsych machine in room with pt.

## 2014-03-18 NOTE — Progress Notes (Signed)
SW spoke with pt who is requesting detox from cocaine and [place]. SW shared with pt that [facility] nor [facility] will detox from cocaine and [facility]. Pt will be given outpatient resources to follow up. Pt will be discharged.Ronnette Juniper made aware. Outpt resources faxed to [facility] ED.  Charlene Brooke, MSW  Social Worker [phone]

## 2014-03-18 NOTE — ED Notes (Signed)
Pt reports wants help to stop using cocaine. Pt says is tired of being on drugs.  Last used cocaine 2 hours ago. Denies SI or HI.

## 2014-03-18 NOTE — BH Assessment (Addendum)
Spoke with ED nursing secretary Pam who will arrange for Tele-psych machine to be set up so TTS assessment can begin.   Consulted with EDP Dr.Cook to obtain clinicals prior to assessing patient.  Shaune Pollack, MS, [place] Assessment Counselor

## 2014-03-18 NOTE — BH Assessment (Signed)
Consulted with AC Letitia Libra and Catalina Pizza who are recommending that patient attempt to seek placement for patient at [facility] or [facility] for treatment if patient can't be placed D/C is recommended to follow-up with Substance Abuse outpatient providers and [facility] for additional linkage to residential treatment programs.  Doris, CSW will seek placement for patient at [facility] and [facility].  Shaune Pollack, MS, S.[place] Assessment Counselor

## 2014-03-18 NOTE — BH Assessment (Signed)
Writer informed TTS Pincus Sanes) of the consult.
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
## 2014-03-18 NOTE — ED Notes (Signed)
Doris from [facility] called and reports that pt can be discharged and she will fax over some outpt resources to give pt at time of discharge.  Will notify Dr. Lacinda Axon.
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
## 2014-03-18 NOTE — ED Notes (Addendum)
Pt states he is here "to be checked in to [facility] because I have a problem with cocaine" Pt last used cocaine 2 hours PTA. Pt is pleasant and agreeable. Pt denies SI/HI.

## 2014-03-18 NOTE — Discharge Instructions (Signed)
°Emergency Department Resource Guide °1) Find a Doctor and Pay Out of Pocket °Although you won't have to find out who is covered by your insurance plan, it is a good idea to ask around and get recommendations. You will then need to call the office and see if the doctor you have chosen will accept you as a new patient and what types of options they offer for patients who are self-pay. Some doctors offer discounts or will set up payment plans for their patients who do not have insurance, but you will need to ask so you aren't surprised when you get to your appointment. ° °2) Contact Your Local Health Department °Not all health departments have doctors that can see patients for sick visits, but many do, so it is worth a call to see if yours does. If you don't know where your local health department is, you can check in your phone book. The CDC also has a tool to help you locate your state's health department, and many state websites also have listings of all of their local health departments. ° °3) Find a Walk-in Clinic °If your illness is not likely to be very severe or complicated, you may want to try a walk in clinic. These are popping up all over the country in pharmacies, drugstores, and shopping centers. They're usually staffed by nurse practitioners or physician assistants that have been trained to treat common illnesses and complaints. They're usually fairly quick and inexpensive. However, if you have serious medical issues or chronic medical problems, these are probably not your best option. ° °No Primary Care Doctor: °- Call [facility] at  832-8000 - they can help you locate a primary care doctor that  accepts your insurance, provides certain services, etc. °- Physician Referral Service- 1-800-533-3463 ° °Chronic Pain Problems: °Organization         Address  Phone   Notes  °[facility]  (336) [phone] Patients need to be referred by their primary care doctor.  ° °Medication  Assistance: °Organization         Address  Phone   Notes  °[place] [facility] [address] °[place], [place] [address] (336) 641-8030 --Must be a resident of [place] °-- Must have NO insurance coverage whatsoever (no Medicaid/ Medicare, etc.) °-- The pt. MUST have a primary care doctor that directs their care regularly and follows them in the community °  °[facility]  (866) 331-1348   °[employer]  (888) 892-1162   ° °Agencies that provide inexpensive medical care: °Organization         Address  Phone   Notes  °[facility]  (336) 832-8035   °[facility]    (336) 832-7272   °[facility] [address] °[place], [place] [address] (336) 832-4777   °[facility] [address], °[place] (336) 271-4999   °[facility]    (336) 373-0678   °[facility]    (336) 272-1050   °[facility] ° [address], [place] Phone:  (336) 832-4444, Fax:  (336) 832-4440 Hours of Operation:  9 am - 6 pm, M-F.  Also accepts Medicaid/Medicare and self-pay.  °[facility] ° [address],  Phone: (336) 832-3150, Fax: (336) 832-3151. Hours of Operation:  8:30 am - 5:30 pm, M-F.  Also accepts Medicaid and self-pay.  °[facility] [place] [address], [place] Phone: (336) 878-6027   °[facility] [address], [place], [place] (336)723-1848, Ext. 123 Mondays & Thursdays: 7-9 AM.  First 15 patients are seen on a first come, first serve basis. °  ° °Medicaid-accepting [place] Providers: ° °Organization         Address  Phone   Notes  °[facility] [address], [place] (336) 641-2100 Also accepts self-pay patients.  °[facility] [address], [place] ° (336) 856-9996   °[facility] [address], [place]  (336) 288-8857   °[facility] [address] [place] [address], [place] (336) 299-7000   °Veita Bland [address], [place]  ° (336) 373-1557 Only accepts [place] Access Medicaid patients after they have their name applied to their card.  ° °Self-Pay (no insurance) in [place]: ° °Organization         Address  Phone   Notes  °Sickle Cell Patients, [facility] [address], [place] (336) 832-1970   °[facility] [address], [place] (336) 832-4400   °[facility] ° [address], [place] (336) 992-4800   °[facility]/Dr. Osei-Bonsu ° [address] [place] [address], [place] or [address], [place] (336) 841-8500 Phone number for both [place] and [place] locations is the same.  °[facility] [address], [place] (336) 299-0000   °[facility] [address] [place] [address], [place] or [address] (336) 852-7530 °(336) 878-2260   °[facility] [address], [place] (336) 350-1642, phone; (336) 294-5005, fax Sees patients 1st and 3rd Saturday of every month.  Must not qualify for public or private insurance (i.e. Medicaid, Medicare, [place] Health Choice, Veterans' Benefits) • Household income should be no more than 200% of the poverty level •The clinic cannot treat you if you are pregnant or think you are pregnant • Sexually transmitted diseases are not treated at the clinic.  ° ° °Dental Care: °Organization         Address  Phone  Notes  °[place] [facility] [address], [place] (336) 641-6152 Accepts children up to age 21 who are enrolled in Medicaid or [place] Health Choice; pregnant women with a Medicaid card; and children who have applied for Medicaid or [place] Health Choice, but were declined, whose parents can pay a reduced fee at time of service.  °[place]  [facility] [place]  [address], [place] (336) 641-7733 Accepts children up to age 21 who are enrolled in Medicaid or [place] Health Choice; pregnant women with a Medicaid card; and children who have applied for Medicaid or [place] Health Choice, but were declined, whose parents can pay a reduced fee at time of service.  °[facility] ° [address],  (336) 641-4533 Patients are seen by appointment only. Walk-ins are not accepted. [facility] will see patients 18 years of age and older. °Monday - Tuesday (8am-5pm) °Most Wednesdays (8:30-5pm) °$30 per visit, cash only  °[facility] ° [address], [place] (336) 641-4533 Patients are seen by appointment only. Walk-ins are not accepted. [facility] will see patients 18 years of age and older. °One   Wednesday Evening (Monthly: Volunteer Based).  $30 per visit, cash only  °[facility]  (919) 537-3737 for adults; Children under age 4, call Graduate Pediatric Dentistry at ([phone]) [phone]. Children aged 4-14, please call (919) [phone] to request a pediatric application. ° Dental services are provided in all areas of dental care including fillings, crowns and bridges, complete and partial dentures, implants, gum treatment, root canals, and extractions. Preventive care is also provided. Treatment is provided to both adults and children. °Patients are selected via a lottery and there is often a waiting list. °  °[facility] [address], °[place] ° (336) 763-8833 [email or web address] °  °[facility] [address], [place], [place] (336)723-1848, Ext. 123 Second and Fourth Thursday of each month, opens at 6:30 AM; Clinic ends at 9 AM.  Patients are seen on a first-come first-served basis, and a limited number are seen during each clinic.  ° °[facility] ° [address], [place], [place] (336) 723-7904    Eligibility Requirements °You must have lived in [place], [place], or [place] counties for at least the last three months. °  You cannot be eligible for state or federal sponsored healthcare insurance, including Veterans Administration, Medicaid, or Medicare. °  You generally cannot be eligible for healthcare insurance through your employer.  °  How to apply: °Eligibility screenings are held every Tuesday and Wednesday afternoon from 1:00 pm until 4:00 pm. You do not need an appointment for the interview!  °[facility] [address], [place], [place] 336-631-2330   °[facility]  336-342-8273   °[facility]  336-703-3100   °[facility]  336-570-6415   ° °Behavioral Health Resources in the Community: °Intensive Outpatient Programs °Organization         Address  Phone  Notes  °[facility] [address], [place], [place] 336-878-6098   °[facility] [address], [place], [place] 336-832-9800   °[facility] [address], [place], [place] ° 336-882-2125   °[facility] [address],  °[place], [place] 1-800-853-5163 or 336-641-4981   °Substance Abuse Resources °Organization         Address  Phone  Notes  °[facility]  336-882-2125   °[facility]  336-784-9470   °[facility]  336-285-9073   °[facility]  336-845-3988   °Residential & Outpatient Substance Abuse Program  1-800-659-3381   °Psychological Services °Organization         Address  Phone  Notes  °[facility]  336- 832-9600   °[facility]  336- 378-7881   °[facility] [address], [place] 1-800-853-5163 or 336-641-4981   ° °Mobile Crisis Teams °Organization         Address  Phone  Notes  °[facility], Mobile Crisis Care Unit  1-877-626-1772   °Assertive °[facility] ° [address]  [place], [place] 336-834-9664   °Sharon DeEsch [address] °[place] 336-554-5454   ° °Self-Help/Support Groups °Organization         Address  Phone             Notes  °[facility]  - variety of support groups  336- 373-1402 Call for more information  °Narcotics Anonymous (NA), [facility] [address], °[place]  2 meetings at this location  ° °  Residential Treatment Programs Organization         Address  Phone  Notes  [facility] [address]    [place]  1-(571)144-4277   [facility]  [address] [place] [address], [place], [place]   [place], [place] (530)744-3161 Admissions: 8am-3pm M-F  [facility] [place] [address],    [place], [place] 734-193-7902   [facility] [address] [place], [place], [place]   [facility] [address]  [place], [place]   [facility] Intensive Outpatient [address], [place], [place]   [facility] ([place]  [place], [place] 1-319-039-9500 or [phone]   [facility] [address] [place], [place] Accepts Medicaid  [facility] [facility] [address],  [place] 1-(818)475-3386 Substance Abuse/Addiction Treatment   [facility] Organization         Address  Phone  Notes  [facility]  534-089-6259   Domenic Schwab, PhD [address] Arlis [place], [place]   832-356-0048 or (231)242-0615   [place], [place] (586)260-8880   [facility] [address], [place], [place] (442)839-6897 Insurance/Medicaid/sponsorship through [facility] and Families [address]                                    [place], [place] [phone] [place] [address], [place] [phone]    Dr. Adele Schilder  340-678-6707   [facility] Dept. 1) [address], [place] 2) [place] 3)  [place] [address], [place] [phone]  [phone]   [place] (934)[phone] or [phone] (After Hours)       Follow-up community mental health resources.

## 2014-04-29 DIAGNOSIS — K7460 Unspecified cirrhosis of liver: Secondary | ICD-10-CM | POA: Diagnosis not present

## 2014-04-29 DIAGNOSIS — K7290 Hepatic failure, unspecified without coma: Secondary | ICD-10-CM | POA: Diagnosis not present

## 2014-06-26 DIAGNOSIS — M542 Cervicalgia: Secondary | ICD-10-CM | POA: Diagnosis not present

## 2014-06-26 DIAGNOSIS — S3991XA Unspecified injury of abdomen, initial encounter: Secondary | ICD-10-CM | POA: Diagnosis not present

## 2014-06-26 DIAGNOSIS — S199XXA Unspecified injury of neck, initial encounter: Secondary | ICD-10-CM | POA: Diagnosis not present

## 2014-06-26 DIAGNOSIS — F419 Anxiety disorder, unspecified: Secondary | ICD-10-CM | POA: Diagnosis not present

## 2014-06-26 DIAGNOSIS — S8991XA Unspecified injury of right lower leg, initial encounter: Secondary | ICD-10-CM | POA: Diagnosis not present

## 2014-06-26 DIAGNOSIS — S299XXA Unspecified injury of thorax, initial encounter: Secondary | ICD-10-CM | POA: Diagnosis not present

## 2014-06-26 DIAGNOSIS — F17200 Nicotine dependence, unspecified, uncomplicated: Secondary | ICD-10-CM | POA: Diagnosis not present

## 2014-06-26 DIAGNOSIS — R109 Unspecified abdominal pain: Secondary | ICD-10-CM | POA: Diagnosis not present

## 2014-06-26 DIAGNOSIS — M549 Dorsalgia, unspecified: Secondary | ICD-10-CM | POA: Diagnosis not present

## 2014-06-26 DIAGNOSIS — K219 Gastro-esophageal reflux disease without esophagitis: Secondary | ICD-10-CM | POA: Diagnosis not present

## 2014-06-26 DIAGNOSIS — S32058A Other fracture of fifth lumbar vertebra, initial encounter for closed fracture: Secondary | ICD-10-CM | POA: Diagnosis not present

## 2014-06-26 DIAGNOSIS — F319 Bipolar disorder, unspecified: Secondary | ICD-10-CM | POA: Diagnosis not present

## 2014-06-26 DIAGNOSIS — Z79899 Other long term (current) drug therapy: Secondary | ICD-10-CM | POA: Diagnosis not present

## 2014-06-26 DIAGNOSIS — M25561 Pain in right knee: Secondary | ICD-10-CM | POA: Diagnosis not present

## 2014-07-21 DIAGNOSIS — K7031 Alcoholic cirrhosis of liver with ascites: Secondary | ICD-10-CM | POA: Diagnosis not present

## 2014-07-21 DIAGNOSIS — Z136 Encounter for screening for cardiovascular disorders: Secondary | ICD-10-CM | POA: Diagnosis not present

## 2014-07-21 DIAGNOSIS — Z0000 Encounter for general adult medical examination without abnormal findings: Secondary | ICD-10-CM | POA: Diagnosis not present

## 2014-07-21 DIAGNOSIS — K121 Other forms of stomatitis: Secondary | ICD-10-CM | POA: Diagnosis not present

## 2014-07-21 DIAGNOSIS — F411 Generalized anxiety disorder: Secondary | ICD-10-CM | POA: Diagnosis not present

## 2014-07-21 DIAGNOSIS — Z125 Encounter for screening for malignant neoplasm of prostate: Secondary | ICD-10-CM | POA: Diagnosis not present

## 2014-07-22 DIAGNOSIS — K7460 Unspecified cirrhosis of liver: Secondary | ICD-10-CM | POA: Diagnosis not present

## 2014-07-31 DIAGNOSIS — I868 Varicose veins of other specified sites: Secondary | ICD-10-CM | POA: Diagnosis not present

## 2014-07-31 DIAGNOSIS — R188 Other ascites: Secondary | ICD-10-CM | POA: Diagnosis not present

## 2014-07-31 DIAGNOSIS — K7460 Unspecified cirrhosis of liver: Secondary | ICD-10-CM | POA: Diagnosis not present

## 2014-07-31 DIAGNOSIS — K769 Liver disease, unspecified: Secondary | ICD-10-CM | POA: Diagnosis not present

## 2014-07-31 DIAGNOSIS — R161 Splenomegaly, not elsewhere classified: Secondary | ICD-10-CM | POA: Diagnosis not present

## 2014-07-31 DIAGNOSIS — R935 Abnormal findings on diagnostic imaging of other abdominal regions, including retroperitoneum: Secondary | ICD-10-CM | POA: Diagnosis not present

## 2014-08-07 DIAGNOSIS — K922 Gastrointestinal hemorrhage, unspecified: Secondary | ICD-10-CM | POA: Diagnosis not present

## 2014-08-07 DIAGNOSIS — R45851 Suicidal ideations: Secondary | ICD-10-CM | POA: Diagnosis not present

## 2014-08-07 DIAGNOSIS — R7881 Bacteremia: Secondary | ICD-10-CM | POA: Diagnosis not present

## 2014-08-07 DIAGNOSIS — G92 Toxic encephalopathy: Secondary | ICD-10-CM | POA: Diagnosis not present

## 2014-08-07 DIAGNOSIS — M6282 Rhabdomyolysis: Secondary | ICD-10-CM | POA: Diagnosis not present

## 2014-08-07 DIAGNOSIS — R079 Chest pain, unspecified: Secondary | ICD-10-CM | POA: Diagnosis not present

## 2014-08-09 DIAGNOSIS — R7881 Bacteremia: Secondary | ICD-10-CM | POA: Diagnosis not present

## 2014-08-09 DIAGNOSIS — K922 Gastrointestinal hemorrhage, unspecified: Secondary | ICD-10-CM | POA: Diagnosis not present

## 2014-08-09 DIAGNOSIS — G92 Toxic encephalopathy: Secondary | ICD-10-CM | POA: Diagnosis not present

## 2014-08-09 DIAGNOSIS — M6282 Rhabdomyolysis: Secondary | ICD-10-CM | POA: Diagnosis not present

## 2014-08-09 DIAGNOSIS — R45851 Suicidal ideations: Secondary | ICD-10-CM | POA: Diagnosis not present

## 2014-08-10 DIAGNOSIS — K7460 Unspecified cirrhosis of liver: Secondary | ICD-10-CM | POA: Diagnosis not present

## 2014-08-19 DIAGNOSIS — R45851 Suicidal ideations: Secondary | ICD-10-CM | POA: Diagnosis not present

## 2014-08-19 DIAGNOSIS — M6282 Rhabdomyolysis: Secondary | ICD-10-CM | POA: Diagnosis not present

## 2014-08-19 DIAGNOSIS — K922 Gastrointestinal hemorrhage, unspecified: Secondary | ICD-10-CM | POA: Diagnosis not present

## 2014-08-19 DIAGNOSIS — K7460 Unspecified cirrhosis of liver: Secondary | ICD-10-CM | POA: Diagnosis not present

## 2014-08-19 DIAGNOSIS — R161 Splenomegaly, not elsewhere classified: Secondary | ICD-10-CM | POA: Diagnosis not present

## 2014-08-19 DIAGNOSIS — R7881 Bacteremia: Secondary | ICD-10-CM | POA: Diagnosis not present

## 2014-08-19 DIAGNOSIS — I864 Gastric varices: Secondary | ICD-10-CM | POA: Diagnosis not present

## 2014-08-19 DIAGNOSIS — G92 Toxic encephalopathy: Secondary | ICD-10-CM | POA: Diagnosis not present

## 2014-09-08 DIAGNOSIS — R934 Abnormal findings on diagnostic imaging of urinary organs: Secondary | ICD-10-CM | POA: Diagnosis not present

## 2014-09-08 DIAGNOSIS — R188 Other ascites: Secondary | ICD-10-CM | POA: Diagnosis not present

## 2014-09-08 DIAGNOSIS — K7460 Unspecified cirrhosis of liver: Secondary | ICD-10-CM | POA: Diagnosis not present

## 2014-09-08 DIAGNOSIS — K7469 Other cirrhosis of liver: Secondary | ICD-10-CM | POA: Diagnosis not present

## 2014-09-08 DIAGNOSIS — R161 Splenomegaly, not elsewhere classified: Secondary | ICD-10-CM | POA: Diagnosis not present

## 2014-10-28 DIAGNOSIS — K7290 Hepatic failure, unspecified without coma: Secondary | ICD-10-CM | POA: Diagnosis not present

## 2014-10-30 DIAGNOSIS — K7290 Hepatic failure, unspecified without coma: Secondary | ICD-10-CM | POA: Diagnosis not present

## 2014-11-03 DIAGNOSIS — K7460 Unspecified cirrhosis of liver: Secondary | ICD-10-CM | POA: Diagnosis not present

## 2014-11-03 DIAGNOSIS — K7290 Hepatic failure, unspecified without coma: Secondary | ICD-10-CM | POA: Diagnosis not present

## 2014-11-04 DIAGNOSIS — K7031 Alcoholic cirrhosis of liver with ascites: Secondary | ICD-10-CM | POA: Diagnosis not present

## 2014-12-10 IMAGING — CR DG CHEST 2V
2 series · 2 of 2 positions shown · non-contrast
Comparison: 04/02/2012

CLINICAL DATA: Fall.

CHEST - 2 VIEW

[view not recorded (1 of 2)]
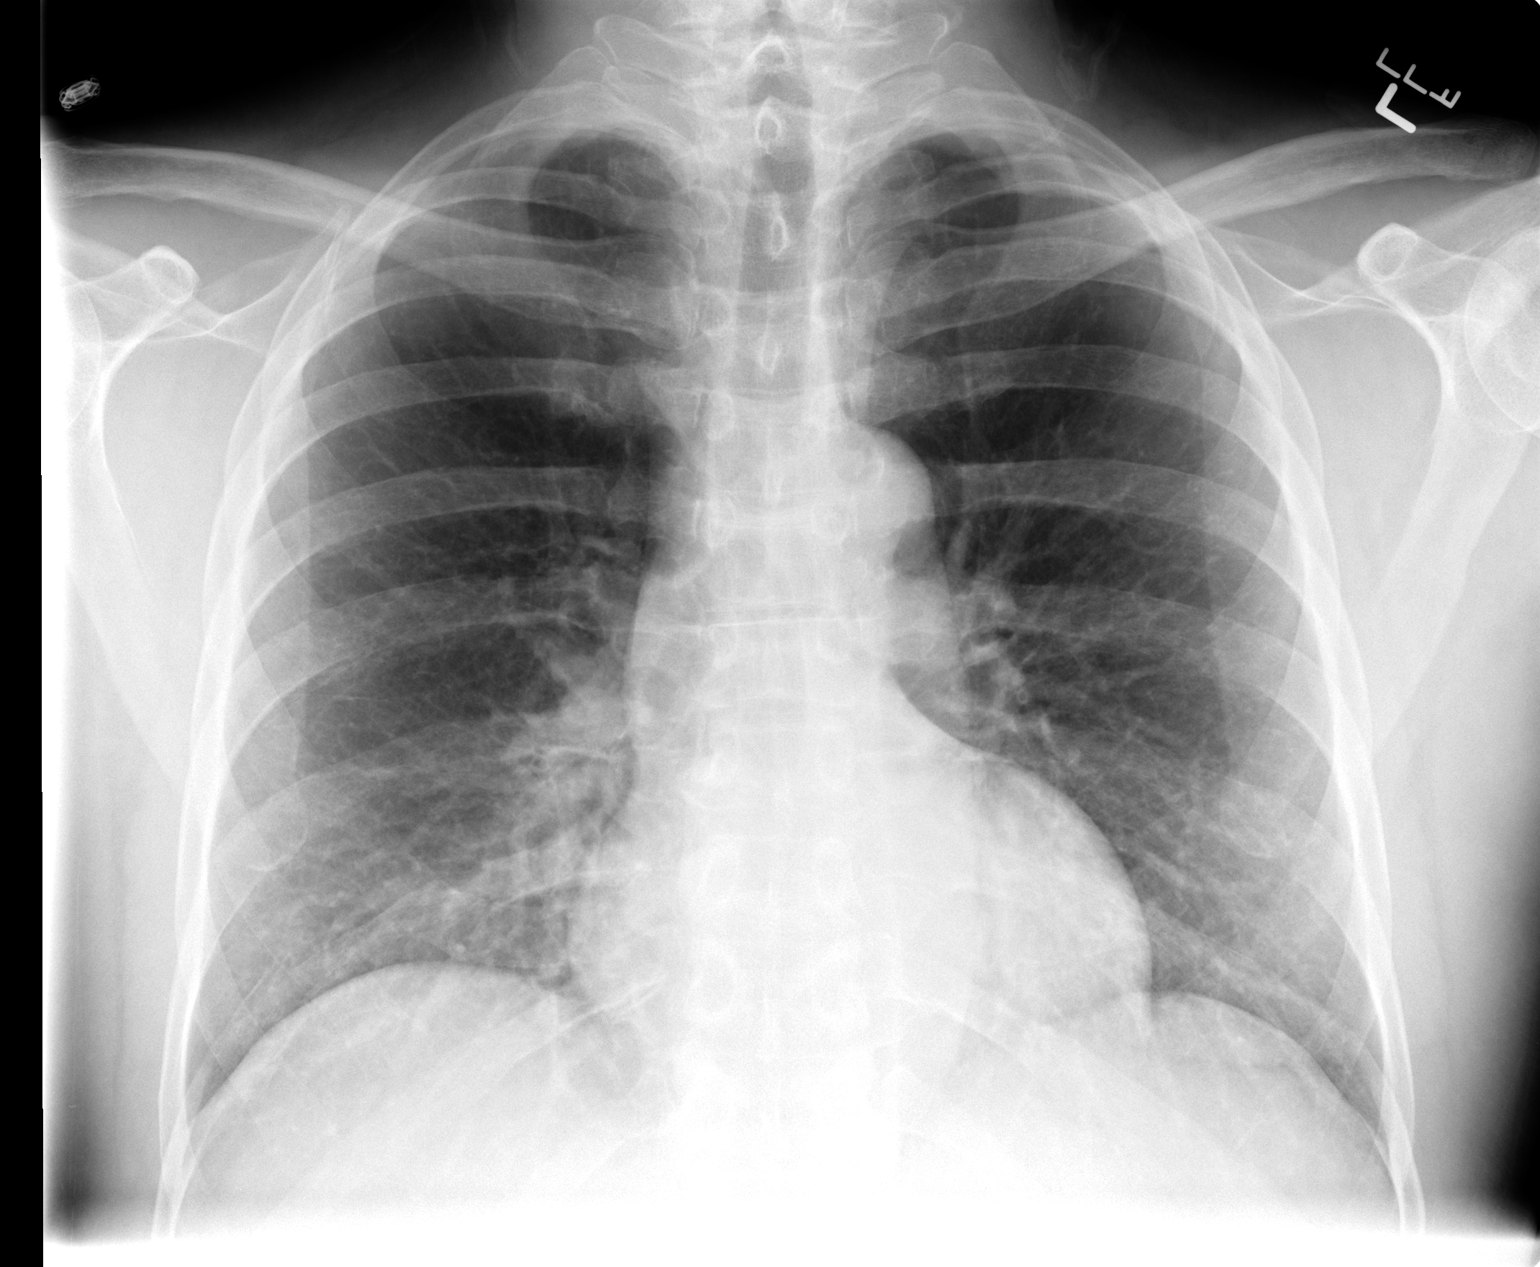

[view not recorded (2 of 2)]
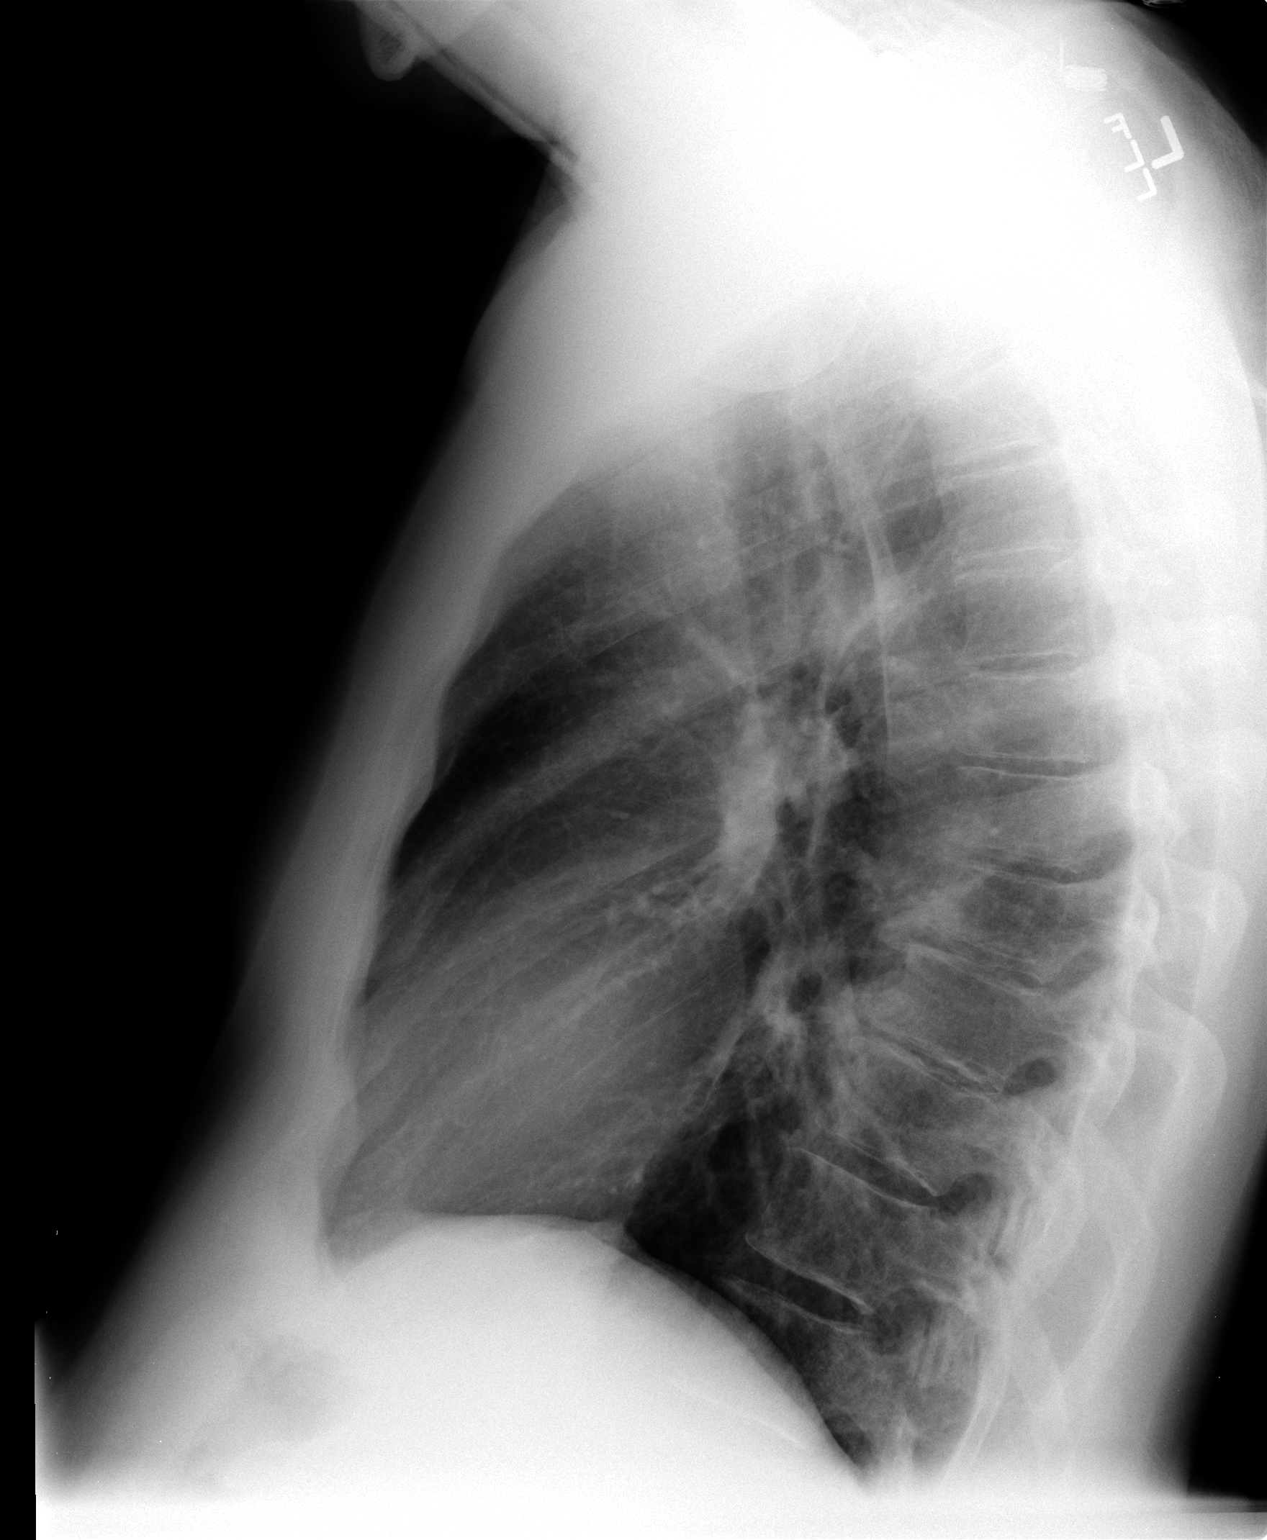

[2 of 2 positions shown; findings below may reference images not displayed]

FINDINGS: Heart and mediastinal contours are within normal limits.
No focal opacities or effusions.  No acute bony abnormality.
IMPRESSION: No active cardiopulmonary disease.

## 2014-12-10 IMAGING — CT CT HEAD W/O CM
4 of 6 series · 15 of 47 positions shown, 16 images · non-contrast
Comparison: 04/02/2012

CLINICAL DATA: Fall

CT HEAD WITHOUT CONTRAST,CT CERVICAL SPINE WITHOUT CONTRAST
TECHNIQUE: Contiguous axial images were obtained from the base of
the skull through the vertex without contrast.,Technique:
Multidetector CT imaging of the cervical spine was performed.
Multiplanar CT image reconstructions were also generated.

[Series 2: headseq 4.8 h37s · axial · 0.50mm/px · z∈[+291,+352]mm · 2 of 36 slices shown, 3 images]
[im 12/36  brain]
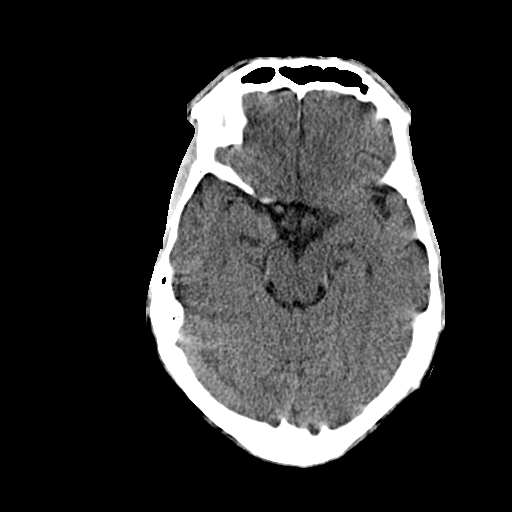
[im 12/36  bone]
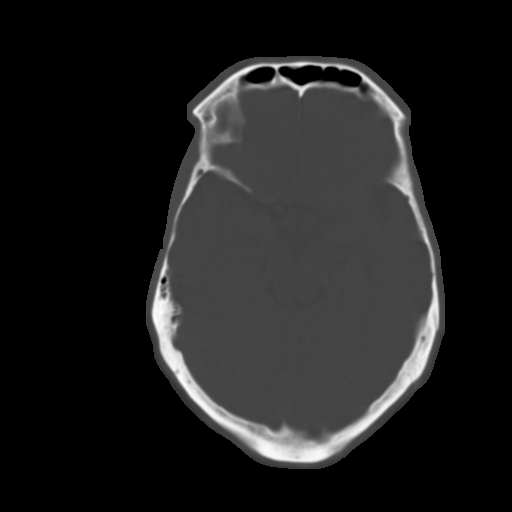
[im 24/36  brain]
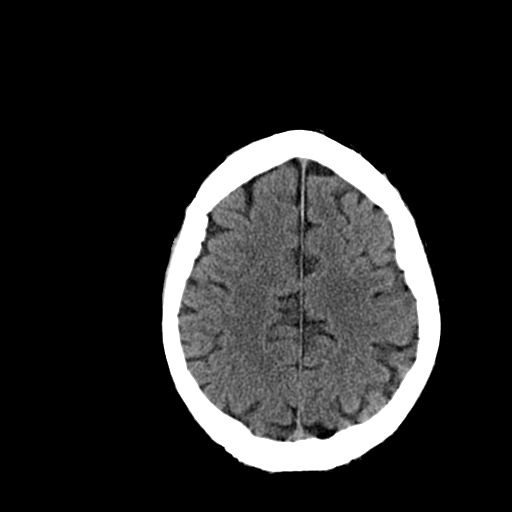

[Series 7: sagittal bone 2.0 · sagittal · 0.31mm/px · 3 of 50 slices shown]
[im 17/50  brain]
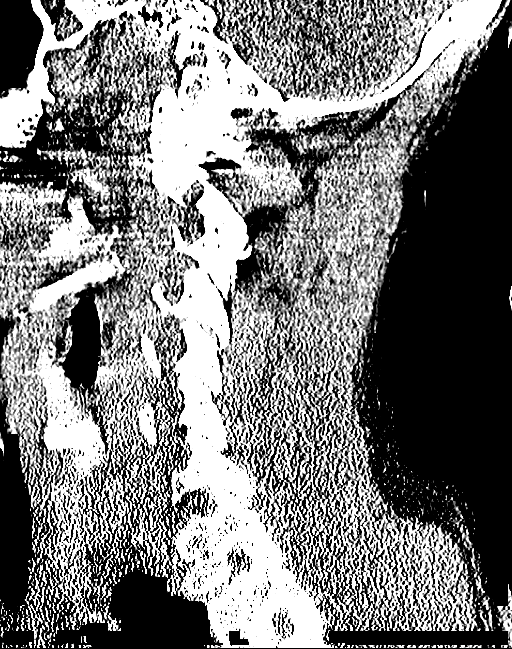
[im 25/50  brain]
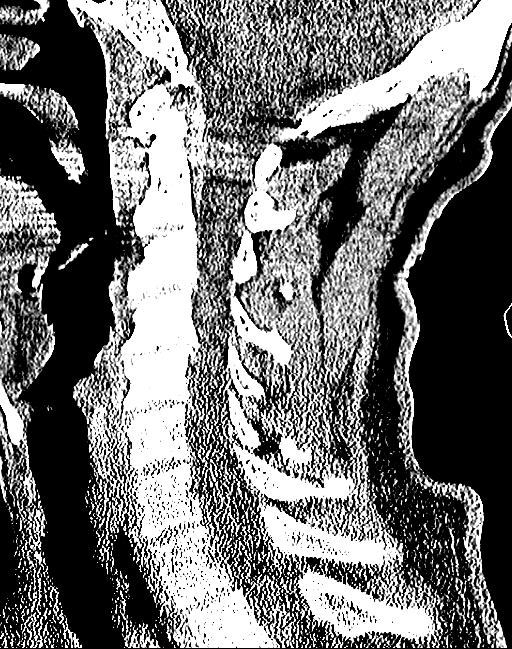
[im 33/50  brain]
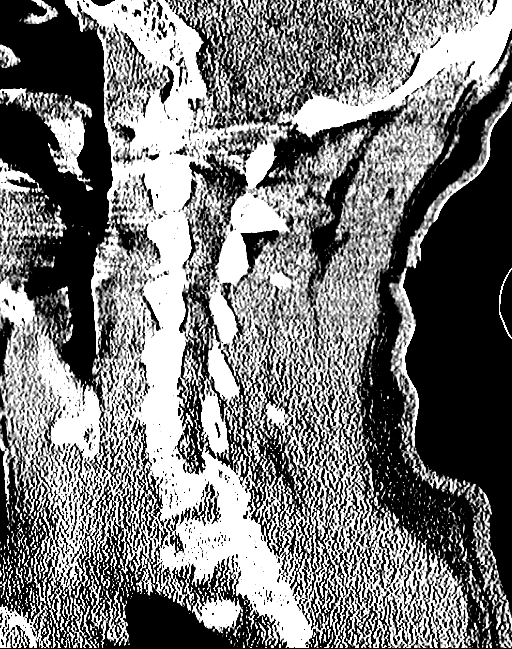

[Series 8: coronal bone 2.0 · coronal · 0.38mm/px · 3 of 43 slices shown]
[im 15/43  brain]
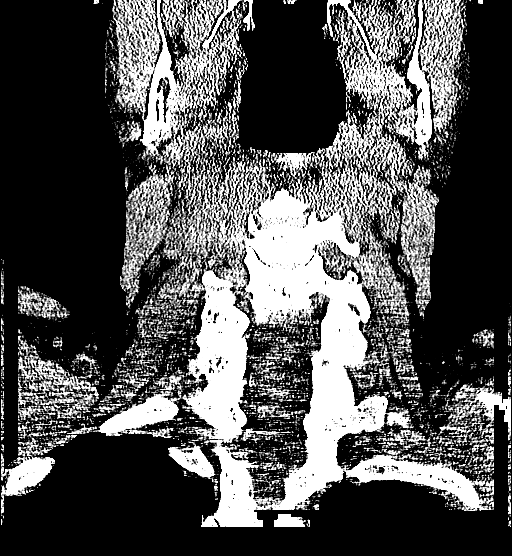
[im 19/43  brain]
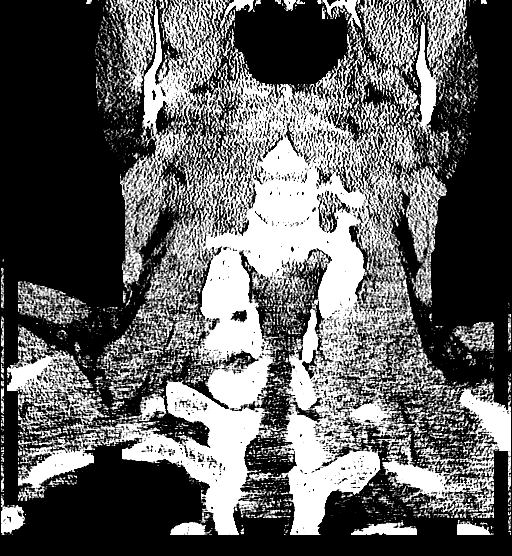
[im 24/43  brain]
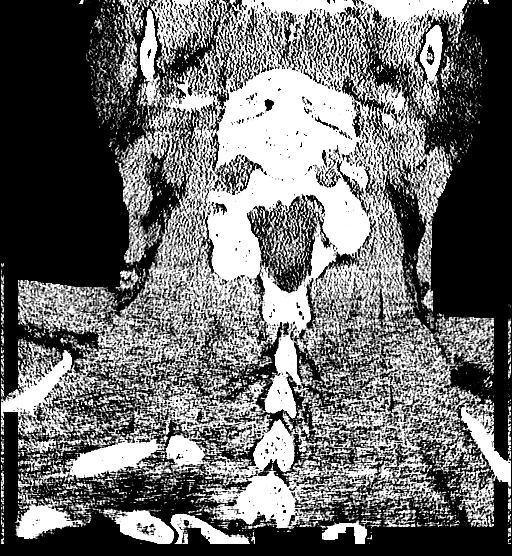

[Series 9: axial bone 2.0 · axial · 0.22mm/px · z∈[+21,+147]mm · 7 of 91 slices shown]
[im 12/91  bone]
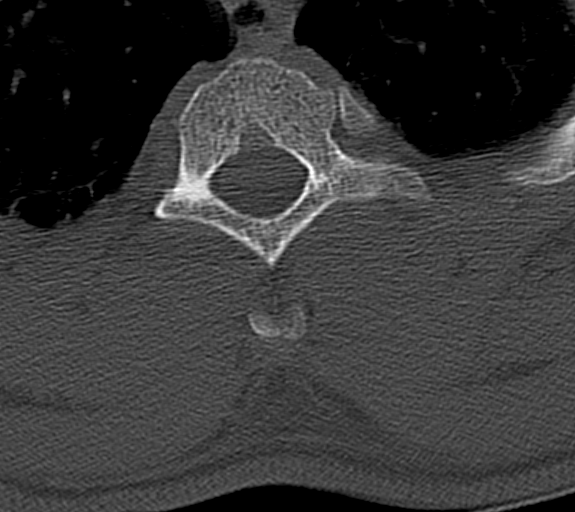
[im 23/91  bone]
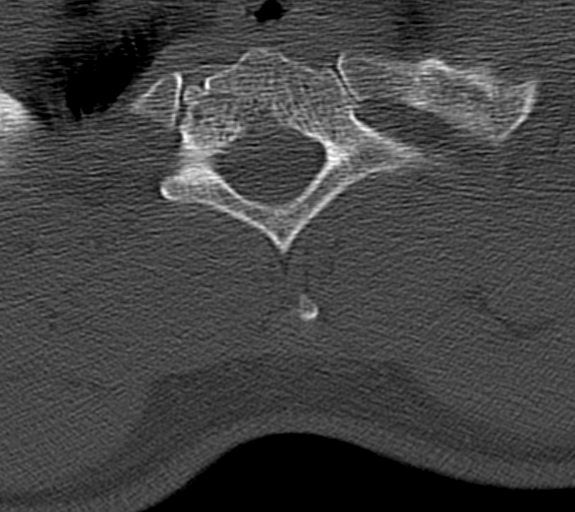
[im 34/91  bone]
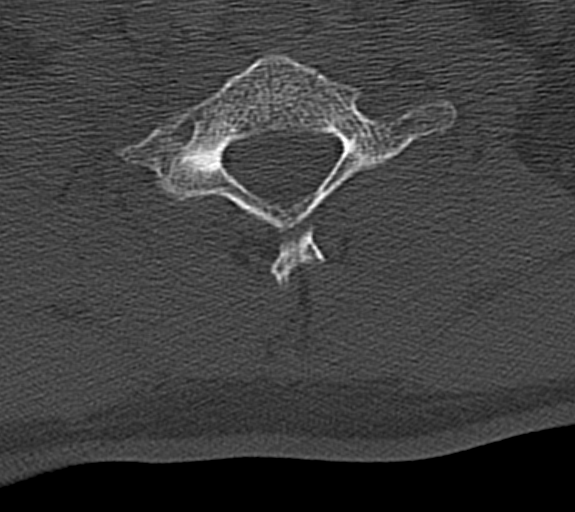
[im 46/91  bone]
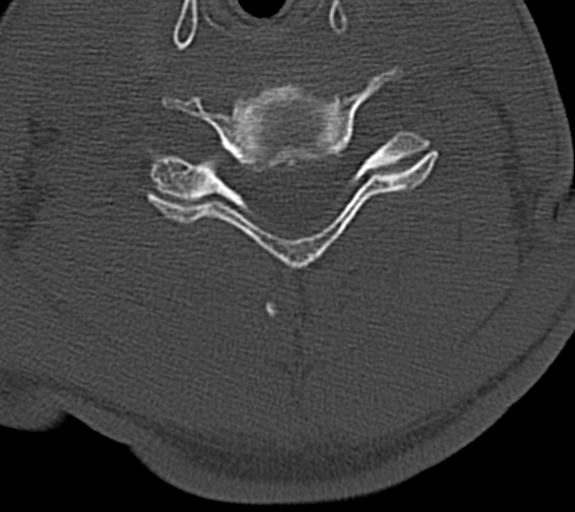
[im 57/91  bone]
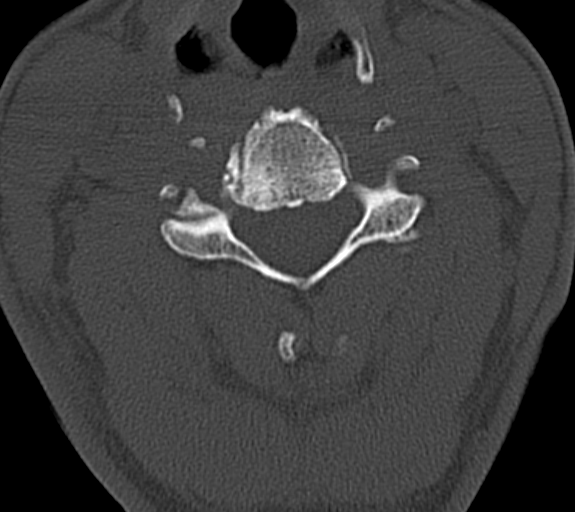
[im 68/91  bone]
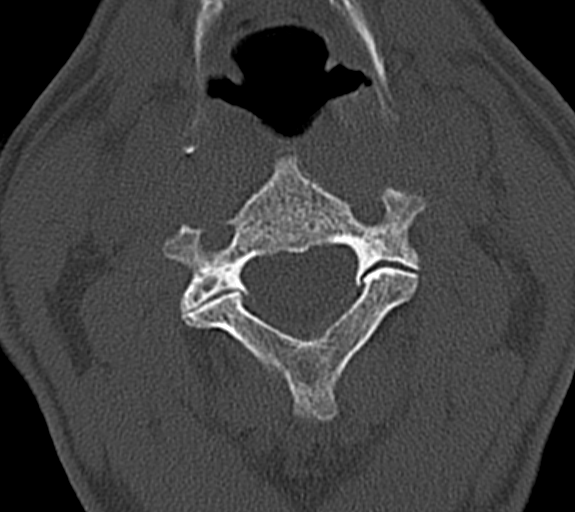
[im 79/91  bone]
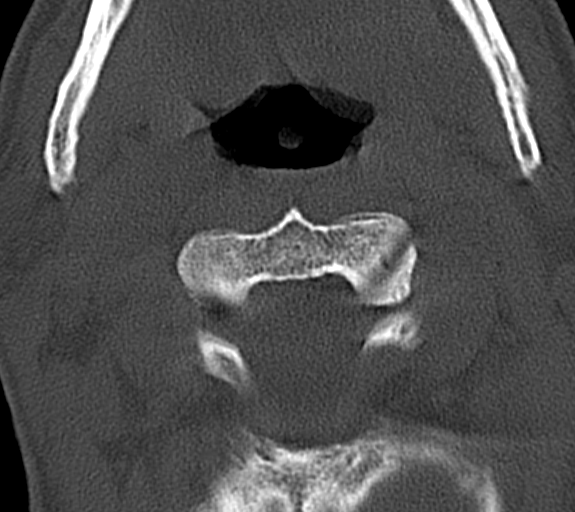

[15 of 47 positions shown; findings below may reference images not displayed]

FINDINGS: No skull fracture is noted. Paranasal sinuses and mastoid
air cells are unremarkable.

No intracranial hemorrhage, mass effect or midline shift.

No acute infarction.  Ventricular size is stable from prior exam.
No mass lesion is noted on this unenhanced scan.
IMPRESSION: No acute intracranial abnormality.

CT cervical spine without IV contrast:
FINDINGS: Axial images of the cervical spine shows no acute fracture or
subluxation.  Emphysematous changes are noted bilateral lung
apices.  No diagnostic pneumothorax.

Computer processed images shows mild degenerative changes on C2
articulation.  Mild disc space flattening with mild anterior
spurring at C4-C5 level.  Minimal disc space flattening with mild
posterior disc bulge at C5-C6 level.  No prevertebral soft tissue
swelling.  Spinal canal is patent.
IMPRESSION: 1.  No acute fracture or subluxation.  Mild degenerative changes as
described above.

## 2014-12-17 DIAGNOSIS — E7220 Disorder of urea cycle metabolism, unspecified: Secondary | ICD-10-CM | POA: Diagnosis not present

## 2014-12-17 DIAGNOSIS — F17200 Nicotine dependence, unspecified, uncomplicated: Secondary | ICD-10-CM | POA: Diagnosis not present

## 2014-12-17 DIAGNOSIS — K7460 Unspecified cirrhosis of liver: Secondary | ICD-10-CM | POA: Diagnosis not present

## 2014-12-17 DIAGNOSIS — Z79899 Other long term (current) drug therapy: Secondary | ICD-10-CM | POA: Diagnosis not present

## 2014-12-17 DIAGNOSIS — K219 Gastro-esophageal reflux disease without esophagitis: Secondary | ICD-10-CM | POA: Diagnosis not present

## 2014-12-17 DIAGNOSIS — F319 Bipolar disorder, unspecified: Secondary | ICD-10-CM | POA: Diagnosis not present

## 2014-12-17 DIAGNOSIS — R531 Weakness: Secondary | ICD-10-CM | POA: Diagnosis not present

## 2014-12-17 DIAGNOSIS — K769 Liver disease, unspecified: Secondary | ICD-10-CM | POA: Diagnosis not present

## 2015-02-02 DIAGNOSIS — K7460 Unspecified cirrhosis of liver: Secondary | ICD-10-CM | POA: Diagnosis not present

## 2015-02-02 DIAGNOSIS — K7290 Hepatic failure, unspecified without coma: Secondary | ICD-10-CM | POA: Diagnosis not present

## 2015-02-12 DIAGNOSIS — K7460 Unspecified cirrhosis of liver: Secondary | ICD-10-CM | POA: Diagnosis not present

## 2015-02-12 DIAGNOSIS — K7290 Hepatic failure, unspecified without coma: Secondary | ICD-10-CM | POA: Diagnosis not present

## 2015-02-12 DIAGNOSIS — R161 Splenomegaly, not elsewhere classified: Secondary | ICD-10-CM | POA: Diagnosis not present

## 2015-02-12 DIAGNOSIS — K7469 Other cirrhosis of liver: Secondary | ICD-10-CM | POA: Diagnosis not present

## 2015-03-01 DIAGNOSIS — K625 Hemorrhage of anus and rectum: Secondary | ICD-10-CM | POA: Diagnosis not present

## 2015-04-21 DIAGNOSIS — R188 Other ascites: Secondary | ICD-10-CM | POA: Diagnosis not present

## 2015-04-21 DIAGNOSIS — K7290 Hepatic failure, unspecified without coma: Secondary | ICD-10-CM | POA: Diagnosis not present

## 2015-04-21 DIAGNOSIS — K7460 Unspecified cirrhosis of liver: Secondary | ICD-10-CM | POA: Diagnosis not present

## 2015-04-26 DIAGNOSIS — F17200 Nicotine dependence, unspecified, uncomplicated: Secondary | ICD-10-CM | POA: Diagnosis not present

## 2015-04-26 DIAGNOSIS — K7460 Unspecified cirrhosis of liver: Secondary | ICD-10-CM | POA: Diagnosis not present

## 2015-04-26 DIAGNOSIS — Z79899 Other long term (current) drug therapy: Secondary | ICD-10-CM | POA: Diagnosis not present

## 2015-04-26 DIAGNOSIS — K7290 Hepatic failure, unspecified without coma: Secondary | ICD-10-CM | POA: Diagnosis not present

## 2015-04-26 DIAGNOSIS — R4182 Altered mental status, unspecified: Secondary | ICD-10-CM | POA: Diagnosis not present

## 2015-04-26 DIAGNOSIS — K219 Gastro-esophageal reflux disease without esophagitis: Secondary | ICD-10-CM | POA: Diagnosis not present

## 2015-06-03 DIAGNOSIS — M79641 Pain in right hand: Secondary | ICD-10-CM | POA: Diagnosis not present

## 2015-06-03 DIAGNOSIS — M25561 Pain in right knee: Secondary | ICD-10-CM | POA: Diagnosis not present

## 2015-06-03 DIAGNOSIS — R296 Repeated falls: Secondary | ICD-10-CM | POA: Diagnosis not present

## 2015-06-07 DIAGNOSIS — F250 Schizoaffective disorder, bipolar type: Secondary | ICD-10-CM | POA: Diagnosis not present

## 2015-06-22 DIAGNOSIS — M25561 Pain in right knee: Secondary | ICD-10-CM | POA: Diagnosis not present

## 2015-06-22 DIAGNOSIS — M1711 Unilateral primary osteoarthritis, right knee: Secondary | ICD-10-CM | POA: Diagnosis not present

## 2015-08-09 DIAGNOSIS — K7460 Unspecified cirrhosis of liver: Secondary | ICD-10-CM | POA: Diagnosis not present

## 2015-08-09 DIAGNOSIS — K7290 Hepatic failure, unspecified without coma: Secondary | ICD-10-CM | POA: Diagnosis not present

## 2015-08-20 DIAGNOSIS — K7460 Unspecified cirrhosis of liver: Secondary | ICD-10-CM | POA: Diagnosis not present

## 2015-08-20 DIAGNOSIS — R161 Splenomegaly, not elsewhere classified: Secondary | ICD-10-CM | POA: Diagnosis not present

## 2015-08-20 DIAGNOSIS — R188 Other ascites: Secondary | ICD-10-CM | POA: Diagnosis not present

## 2015-10-06 DIAGNOSIS — R079 Chest pain, unspecified: Secondary | ICD-10-CM | POA: Diagnosis not present

## 2015-10-06 DIAGNOSIS — R531 Weakness: Secondary | ICD-10-CM | POA: Diagnosis not present

## 2015-10-26 DIAGNOSIS — W0110XA Fall on same level from slipping, tripping and stumbling with subsequent striking against unspecified object, initial encounter: Secondary | ICD-10-CM | POA: Diagnosis not present

## 2015-10-26 DIAGNOSIS — K219 Gastro-esophageal reflux disease without esophagitis: Secondary | ICD-10-CM | POA: Diagnosis not present

## 2015-10-26 DIAGNOSIS — R410 Disorientation, unspecified: Secondary | ICD-10-CM | POA: Diagnosis not present

## 2015-10-26 DIAGNOSIS — S0990XA Unspecified injury of head, initial encounter: Secondary | ICD-10-CM | POA: Diagnosis not present

## 2015-10-26 DIAGNOSIS — Z79899 Other long term (current) drug therapy: Secondary | ICD-10-CM | POA: Diagnosis not present

## 2015-10-26 DIAGNOSIS — G44311 Acute post-traumatic headache, intractable: Secondary | ICD-10-CM | POA: Diagnosis not present

## 2015-11-01 DIAGNOSIS — K7460 Unspecified cirrhosis of liver: Secondary | ICD-10-CM | POA: Diagnosis not present

## 2015-11-01 DIAGNOSIS — E7220 Disorder of urea cycle metabolism, unspecified: Secondary | ICD-10-CM | POA: Diagnosis not present

## 2015-11-01 DIAGNOSIS — R0602 Shortness of breath: Secondary | ICD-10-CM | POA: Diagnosis not present

## 2016-03-06 DIAGNOSIS — F329 Major depressive disorder, single episode, unspecified: Secondary | ICD-10-CM | POA: Diagnosis not present

## 2016-03-06 DIAGNOSIS — F17200 Nicotine dependence, unspecified, uncomplicated: Secondary | ICD-10-CM | POA: Diagnosis not present

## 2016-03-06 DIAGNOSIS — K7030 Alcoholic cirrhosis of liver without ascites: Secondary | ICD-10-CM | POA: Diagnosis not present

## 2016-03-06 DIAGNOSIS — R45851 Suicidal ideations: Secondary | ICD-10-CM | POA: Diagnosis not present

## 2016-03-19 DIAGNOSIS — R4182 Altered mental status, unspecified: Secondary | ICD-10-CM | POA: Diagnosis not present

## 2016-03-19 DIAGNOSIS — R55 Syncope and collapse: Secondary | ICD-10-CM | POA: Diagnosis not present

## 2016-03-19 DIAGNOSIS — R404 Transient alteration of awareness: Secondary | ICD-10-CM | POA: Diagnosis not present

## 2016-03-19 DIAGNOSIS — D696 Thrombocytopenia, unspecified: Secondary | ICD-10-CM | POA: Diagnosis not present

## 2016-04-04 DIAGNOSIS — R05 Cough: Secondary | ICD-10-CM | POA: Diagnosis not present

## 2016-04-04 DIAGNOSIS — R509 Fever, unspecified: Secondary | ICD-10-CM | POA: Diagnosis not present

## 2016-04-04 DIAGNOSIS — R1030 Lower abdominal pain, unspecified: Secondary | ICD-10-CM | POA: Diagnosis not present

## 2016-04-04 DIAGNOSIS — R197 Diarrhea, unspecified: Secondary | ICD-10-CM | POA: Diagnosis not present

## 2016-04-04 DIAGNOSIS — K659 Peritonitis, unspecified: Secondary | ICD-10-CM | POA: Diagnosis not present

## 2016-04-17 DIAGNOSIS — Z13220 Encounter for screening for lipoid disorders: Secondary | ICD-10-CM | POA: Diagnosis not present

## 2016-04-17 DIAGNOSIS — Z131 Encounter for screening for diabetes mellitus: Secondary | ICD-10-CM | POA: Diagnosis not present

## 2016-04-17 DIAGNOSIS — Z0000 Encounter for general adult medical examination without abnormal findings: Secondary | ICD-10-CM | POA: Diagnosis not present

## 2016-04-17 DIAGNOSIS — Z125 Encounter for screening for malignant neoplasm of prostate: Secondary | ICD-10-CM | POA: Diagnosis not present

## 2016-04-24 DIAGNOSIS — E7220 Disorder of urea cycle metabolism, unspecified: Secondary | ICD-10-CM | POA: Diagnosis not present

## 2016-04-24 DIAGNOSIS — Z79899 Other long term (current) drug therapy: Secondary | ICD-10-CM | POA: Diagnosis not present

## 2016-04-24 DIAGNOSIS — F419 Anxiety disorder, unspecified: Secondary | ICD-10-CM | POA: Diagnosis not present

## 2016-04-24 DIAGNOSIS — R410 Disorientation, unspecified: Secondary | ICD-10-CM | POA: Diagnosis not present

## 2016-04-24 DIAGNOSIS — K219 Gastro-esophageal reflux disease without esophagitis: Secondary | ICD-10-CM | POA: Diagnosis not present

## 2016-04-24 DIAGNOSIS — K7460 Unspecified cirrhosis of liver: Secondary | ICD-10-CM | POA: Diagnosis not present

## 2016-04-24 DIAGNOSIS — F319 Bipolar disorder, unspecified: Secondary | ICD-10-CM | POA: Diagnosis not present

## 2016-04-24 DIAGNOSIS — Z87891 Personal history of nicotine dependence: Secondary | ICD-10-CM | POA: Diagnosis not present

## 2016-04-24 DIAGNOSIS — R4182 Altered mental status, unspecified: Secondary | ICD-10-CM | POA: Diagnosis not present

## 2016-04-24 DIAGNOSIS — R109 Unspecified abdominal pain: Secondary | ICD-10-CM | POA: Diagnosis not present

## 2016-04-24 DIAGNOSIS — R1030 Lower abdominal pain, unspecified: Secondary | ICD-10-CM | POA: Diagnosis not present

## 2016-04-25 DIAGNOSIS — K219 Gastro-esophageal reflux disease without esophagitis: Secondary | ICD-10-CM | POA: Diagnosis not present

## 2016-09-15 DIAGNOSIS — K219 Gastro-esophageal reflux disease without esophagitis: Secondary | ICD-10-CM | POA: Diagnosis not present

## 2016-09-15 DIAGNOSIS — K7469 Other cirrhosis of liver: Secondary | ICD-10-CM | POA: Diagnosis not present

## 2016-10-07 DIAGNOSIS — I8511 Secondary esophageal varices with bleeding: Secondary | ICD-10-CM | POA: Diagnosis not present

## 2016-10-07 DIAGNOSIS — F17210 Nicotine dependence, cigarettes, uncomplicated: Secondary | ICD-10-CM | POA: Diagnosis not present

## 2016-10-07 DIAGNOSIS — K7031 Alcoholic cirrhosis of liver with ascites: Secondary | ICD-10-CM | POA: Diagnosis not present

## 2016-10-07 DIAGNOSIS — K7040 Alcoholic hepatic failure without coma: Secondary | ICD-10-CM | POA: Diagnosis not present

## 2016-10-07 DIAGNOSIS — D6959 Other secondary thrombocytopenia: Secondary | ICD-10-CM | POA: Diagnosis not present

## 2016-10-08 DIAGNOSIS — D6959 Other secondary thrombocytopenia: Secondary | ICD-10-CM | POA: Diagnosis not present

## 2016-10-08 DIAGNOSIS — F17210 Nicotine dependence, cigarettes, uncomplicated: Secondary | ICD-10-CM | POA: Diagnosis not present

## 2016-10-08 DIAGNOSIS — I8511 Secondary esophageal varices with bleeding: Secondary | ICD-10-CM | POA: Diagnosis not present

## 2016-10-08 DIAGNOSIS — K7031 Alcoholic cirrhosis of liver with ascites: Secondary | ICD-10-CM | POA: Diagnosis not present

## 2016-10-08 DIAGNOSIS — K7040 Alcoholic hepatic failure without coma: Secondary | ICD-10-CM | POA: Diagnosis not present

## 2016-10-09 DIAGNOSIS — F17210 Nicotine dependence, cigarettes, uncomplicated: Secondary | ICD-10-CM | POA: Diagnosis not present

## 2016-10-09 DIAGNOSIS — D6959 Other secondary thrombocytopenia: Secondary | ICD-10-CM | POA: Diagnosis not present

## 2016-10-09 DIAGNOSIS — I8511 Secondary esophageal varices with bleeding: Secondary | ICD-10-CM | POA: Diagnosis not present

## 2016-10-09 DIAGNOSIS — K7031 Alcoholic cirrhosis of liver with ascites: Secondary | ICD-10-CM | POA: Diagnosis not present

## 2016-10-09 DIAGNOSIS — K7040 Alcoholic hepatic failure without coma: Secondary | ICD-10-CM | POA: Diagnosis not present

## 2017-05-28 DIAGNOSIS — M25461 Effusion, right knee: Secondary | ICD-10-CM | POA: Diagnosis not present

## 2017-05-28 DIAGNOSIS — M170 Bilateral primary osteoarthritis of knee: Secondary | ICD-10-CM | POA: Diagnosis not present

## 2017-05-28 DIAGNOSIS — M25561 Pain in right knee: Secondary | ICD-10-CM | POA: Diagnosis not present

## 2017-06-04 DIAGNOSIS — M170 Bilateral primary osteoarthritis of knee: Secondary | ICD-10-CM | POA: Diagnosis not present

## 2017-06-12 DIAGNOSIS — F411 Generalized anxiety disorder: Secondary | ICD-10-CM | POA: Diagnosis not present

## 2017-06-12 DIAGNOSIS — K7469 Other cirrhosis of liver: Secondary | ICD-10-CM | POA: Diagnosis not present

## 2017-06-12 DIAGNOSIS — K219 Gastro-esophageal reflux disease without esophagitis: Secondary | ICD-10-CM | POA: Diagnosis not present

## 2017-06-12 DIAGNOSIS — M170 Bilateral primary osteoarthritis of knee: Secondary | ICD-10-CM | POA: Diagnosis not present

## 2017-06-19 DIAGNOSIS — M170 Bilateral primary osteoarthritis of knee: Secondary | ICD-10-CM | POA: Diagnosis not present

## 2017-06-22 DIAGNOSIS — K7469 Other cirrhosis of liver: Secondary | ICD-10-CM | POA: Diagnosis not present

## 2017-06-22 DIAGNOSIS — K219 Gastro-esophageal reflux disease without esophagitis: Secondary | ICD-10-CM | POA: Diagnosis not present

## 2017-06-22 DIAGNOSIS — F411 Generalized anxiety disorder: Secondary | ICD-10-CM | POA: Diagnosis not present

## 2017-06-22 DIAGNOSIS — Z13220 Encounter for screening for lipoid disorders: Secondary | ICD-10-CM | POA: Diagnosis not present

## 2017-06-26 DIAGNOSIS — M170 Bilateral primary osteoarthritis of knee: Secondary | ICD-10-CM | POA: Diagnosis not present

## 2017-06-28 NOTE — Congregational Nurse Program (Signed)
Congregational Nurse Program Note  Date of Encounter: 06/25/2017  Past Medical History: Past Medical History:  Diagnosis Date  . Anxiety   . Chronic mental illness    Unspecified, Pt unable to give any further hx as to what kind.  . Depression   . Seizures     Encounter Details: CNP Questionnaire - 06/25/17 1100      Questionnaire   Patient Status  Not Applicable    Race  White or Caucasian    Location Patient [place]  Medicare    Uninsured  Not Applicable    Food  Yes, have food insecurities    Housing/Utilities  Yes, have permanent housing    Transportation  Yes, need transportation assistance    Interpersonal Safety  Yes, feel physically and emotionally safe where you currently live    Medication  No medication insecurities    Medical Provider  Yes    Referrals  Not Applicable    ED Visit Averted  Not Applicable      Cllent was seen at [facility] with questions about transportation and other services available. Cient has Medicare disability and has a PCP. Offered support to client and information needed. Tarri Fuller [phone].

## 2017-09-13 DIAGNOSIS — K760 Fatty (change of) liver, not elsewhere classified: Secondary | ICD-10-CM | POA: Diagnosis not present

## 2017-09-13 DIAGNOSIS — R05 Cough: Secondary | ICD-10-CM | POA: Diagnosis not present

## 2017-09-13 DIAGNOSIS — K859 Acute pancreatitis without necrosis or infection, unspecified: Secondary | ICD-10-CM | POA: Diagnosis not present

## 2017-09-13 DIAGNOSIS — K7290 Hepatic failure, unspecified without coma: Secondary | ICD-10-CM | POA: Diagnosis not present

## 2017-09-13 DIAGNOSIS — R7989 Other specified abnormal findings of blood chemistry: Secondary | ICD-10-CM | POA: Diagnosis not present

## 2017-09-13 DIAGNOSIS — J3489 Other specified disorders of nose and nasal sinuses: Secondary | ICD-10-CM | POA: Diagnosis not present

## 2017-11-11 DIAGNOSIS — F329 Major depressive disorder, single episode, unspecified: Secondary | ICD-10-CM | POA: Diagnosis not present

## 2017-11-11 DIAGNOSIS — D473 Essential (hemorrhagic) thrombocythemia: Secondary | ICD-10-CM | POA: Diagnosis not present

## 2017-11-11 DIAGNOSIS — R945 Abnormal results of liver function studies: Secondary | ICD-10-CM | POA: Diagnosis not present

## 2017-11-11 DIAGNOSIS — F17210 Nicotine dependence, cigarettes, uncomplicated: Secondary | ICD-10-CM | POA: Diagnosis not present

## 2017-11-11 DIAGNOSIS — F10920 Alcohol use, unspecified with intoxication, uncomplicated: Secondary | ICD-10-CM | POA: Diagnosis not present

## 2017-11-11 DIAGNOSIS — F419 Anxiety disorder, unspecified: Secondary | ICD-10-CM | POA: Diagnosis not present

## 2017-12-22 DIAGNOSIS — R45850 Homicidal ideations: Secondary | ICD-10-CM | POA: Diagnosis not present

## 2017-12-22 DIAGNOSIS — R45851 Suicidal ideations: Secondary | ICD-10-CM | POA: Diagnosis not present

## 2017-12-22 DIAGNOSIS — R0789 Other chest pain: Secondary | ICD-10-CM | POA: Diagnosis not present

## 2017-12-22 DIAGNOSIS — Z8659 Personal history of other mental and behavioral disorders: Secondary | ICD-10-CM | POA: Diagnosis not present

## 2017-12-22 DIAGNOSIS — R9431 Abnormal electrocardiogram [ECG] [EKG]: Secondary | ICD-10-CM | POA: Diagnosis not present

## 2017-12-22 DIAGNOSIS — F329 Major depressive disorder, single episode, unspecified: Secondary | ICD-10-CM | POA: Diagnosis not present

## 2017-12-22 DIAGNOSIS — K7460 Unspecified cirrhosis of liver: Secondary | ICD-10-CM | POA: Diagnosis not present

## 2017-12-31 DIAGNOSIS — K852 Alcohol induced acute pancreatitis without necrosis or infection: Secondary | ICD-10-CM | POA: Diagnosis not present

## 2017-12-31 DIAGNOSIS — K7460 Unspecified cirrhosis of liver: Secondary | ICD-10-CM | POA: Diagnosis not present

## 2017-12-31 DIAGNOSIS — K859 Acute pancreatitis without necrosis or infection, unspecified: Secondary | ICD-10-CM | POA: Diagnosis not present

## 2017-12-31 DIAGNOSIS — F1410 Cocaine abuse, uncomplicated: Secondary | ICD-10-CM | POA: Diagnosis not present

## 2017-12-31 DIAGNOSIS — F17200 Nicotine dependence, unspecified, uncomplicated: Secondary | ICD-10-CM | POA: Diagnosis not present

## 2017-12-31 DIAGNOSIS — K7290 Hepatic failure, unspecified without coma: Secondary | ICD-10-CM | POA: Diagnosis not present

## 2017-12-31 DIAGNOSIS — F329 Major depressive disorder, single episode, unspecified: Secondary | ICD-10-CM | POA: Diagnosis not present

## 2017-12-31 DIAGNOSIS — K7200 Acute and subacute hepatic failure without coma: Secondary | ICD-10-CM | POA: Diagnosis not present

## 2017-12-31 DIAGNOSIS — Z79899 Other long term (current) drug therapy: Secondary | ICD-10-CM | POA: Diagnosis not present

## 2017-12-31 DIAGNOSIS — K8020 Calculus of gallbladder without cholecystitis without obstruction: Secondary | ICD-10-CM | POA: Diagnosis not present

## 2017-12-31 DIAGNOSIS — F1210 Cannabis abuse, uncomplicated: Secondary | ICD-10-CM | POA: Diagnosis not present

## 2017-12-31 DIAGNOSIS — F319 Bipolar disorder, unspecified: Secondary | ICD-10-CM | POA: Diagnosis not present

## 2017-12-31 DIAGNOSIS — Z23 Encounter for immunization: Secondary | ICD-10-CM | POA: Diagnosis not present

## 2017-12-31 DIAGNOSIS — D6959 Other secondary thrombocytopenia: Secondary | ICD-10-CM | POA: Diagnosis not present

## 2018-01-01 DIAGNOSIS — F1410 Cocaine abuse, uncomplicated: Secondary | ICD-10-CM | POA: Diagnosis not present

## 2018-01-01 DIAGNOSIS — Z79899 Other long term (current) drug therapy: Secondary | ICD-10-CM | POA: Diagnosis not present

## 2018-01-01 DIAGNOSIS — F1210 Cannabis abuse, uncomplicated: Secondary | ICD-10-CM | POA: Diagnosis not present

## 2018-01-01 DIAGNOSIS — K852 Alcohol induced acute pancreatitis without necrosis or infection: Secondary | ICD-10-CM | POA: Diagnosis not present

## 2018-01-01 DIAGNOSIS — F319 Bipolar disorder, unspecified: Secondary | ICD-10-CM | POA: Diagnosis not present

## 2018-01-01 DIAGNOSIS — K7200 Acute and subacute hepatic failure without coma: Secondary | ICD-10-CM | POA: Diagnosis not present

## 2018-01-01 DIAGNOSIS — D6959 Other secondary thrombocytopenia: Secondary | ICD-10-CM | POA: Diagnosis not present

## 2018-01-01 DIAGNOSIS — K7460 Unspecified cirrhosis of liver: Secondary | ICD-10-CM | POA: Diagnosis not present

## 2018-01-01 DIAGNOSIS — F17200 Nicotine dependence, unspecified, uncomplicated: Secondary | ICD-10-CM | POA: Diagnosis not present

## 2018-01-02 DIAGNOSIS — D6959 Other secondary thrombocytopenia: Secondary | ICD-10-CM | POA: Diagnosis not present

## 2018-01-02 DIAGNOSIS — K7200 Acute and subacute hepatic failure without coma: Secondary | ICD-10-CM | POA: Diagnosis not present

## 2018-01-02 DIAGNOSIS — Z79899 Other long term (current) drug therapy: Secondary | ICD-10-CM | POA: Diagnosis not present

## 2018-01-02 DIAGNOSIS — K7460 Unspecified cirrhosis of liver: Secondary | ICD-10-CM | POA: Diagnosis not present

## 2018-01-02 DIAGNOSIS — F1410 Cocaine abuse, uncomplicated: Secondary | ICD-10-CM | POA: Diagnosis not present

## 2018-01-02 DIAGNOSIS — F17200 Nicotine dependence, unspecified, uncomplicated: Secondary | ICD-10-CM | POA: Diagnosis not present

## 2018-01-02 DIAGNOSIS — F1210 Cannabis abuse, uncomplicated: Secondary | ICD-10-CM | POA: Diagnosis not present

## 2018-01-02 DIAGNOSIS — K852 Alcohol induced acute pancreatitis without necrosis or infection: Secondary | ICD-10-CM | POA: Diagnosis not present

## 2018-01-02 DIAGNOSIS — F319 Bipolar disorder, unspecified: Secondary | ICD-10-CM | POA: Diagnosis not present

## 2018-01-03 DIAGNOSIS — F319 Bipolar disorder, unspecified: Secondary | ICD-10-CM | POA: Diagnosis not present

## 2018-01-03 DIAGNOSIS — K852 Alcohol induced acute pancreatitis without necrosis or infection: Secondary | ICD-10-CM | POA: Diagnosis not present

## 2018-01-03 DIAGNOSIS — D6959 Other secondary thrombocytopenia: Secondary | ICD-10-CM | POA: Diagnosis not present

## 2018-01-03 DIAGNOSIS — Z23 Encounter for immunization: Secondary | ICD-10-CM | POA: Diagnosis not present

## 2018-01-03 DIAGNOSIS — K7200 Acute and subacute hepatic failure without coma: Secondary | ICD-10-CM | POA: Diagnosis not present

## 2018-01-03 DIAGNOSIS — K7460 Unspecified cirrhosis of liver: Secondary | ICD-10-CM | POA: Diagnosis not present

## 2018-04-09 DIAGNOSIS — E782 Mixed hyperlipidemia: Secondary | ICD-10-CM | POA: Diagnosis not present

## 2018-04-09 DIAGNOSIS — Z1389 Encounter for screening for other disorder: Secondary | ICD-10-CM | POA: Diagnosis not present

## 2018-04-09 DIAGNOSIS — Z0000 Encounter for general adult medical examination without abnormal findings: Secondary | ICD-10-CM | POA: Diagnosis not present

## 2018-04-09 DIAGNOSIS — K7469 Other cirrhosis of liver: Secondary | ICD-10-CM | POA: Diagnosis not present

## 2018-04-09 DIAGNOSIS — K219 Gastro-esophageal reflux disease without esophagitis: Secondary | ICD-10-CM | POA: Diagnosis not present

## 2018-04-09 DIAGNOSIS — Z6828 Body mass index (BMI) 28.0-28.9, adult: Secondary | ICD-10-CM | POA: Diagnosis not present

## 2018-04-09 DIAGNOSIS — F411 Generalized anxiety disorder: Secondary | ICD-10-CM | POA: Diagnosis not present

## 2018-05-06 DIAGNOSIS — R070 Pain in throat: Secondary | ICD-10-CM | POA: Diagnosis not present

## 2018-05-06 DIAGNOSIS — T17920A Food in respiratory tract, part unspecified causing asphyxiation, initial encounter: Secondary | ICD-10-CM | POA: Diagnosis not present

## 2018-05-06 DIAGNOSIS — R0989 Other specified symptoms and signs involving the circulatory and respiratory systems: Secondary | ICD-10-CM | POA: Diagnosis not present

## 2018-05-07 DIAGNOSIS — M170 Bilateral primary osteoarthritis of knee: Secondary | ICD-10-CM | POA: Diagnosis not present

## 2018-05-07 DIAGNOSIS — M25461 Effusion, right knee: Secondary | ICD-10-CM | POA: Diagnosis not present

## 2018-06-15 DIAGNOSIS — S0083XA Contusion of other part of head, initial encounter: Secondary | ICD-10-CM | POA: Diagnosis not present

## 2018-06-15 DIAGNOSIS — K7460 Unspecified cirrhosis of liver: Secondary | ICD-10-CM | POA: Diagnosis not present

## 2018-06-15 DIAGNOSIS — W1839XA Other fall on same level, initial encounter: Secondary | ICD-10-CM | POA: Diagnosis not present

## 2018-06-15 DIAGNOSIS — R58 Hemorrhage, not elsewhere classified: Secondary | ICD-10-CM | POA: Diagnosis not present

## 2018-06-15 DIAGNOSIS — R4781 Slurred speech: Secondary | ICD-10-CM | POA: Diagnosis not present

## 2018-06-15 DIAGNOSIS — S0990XA Unspecified injury of head, initial encounter: Secondary | ICD-10-CM | POA: Diagnosis not present

## 2018-06-15 DIAGNOSIS — S0081XA Abrasion of other part of head, initial encounter: Secondary | ICD-10-CM | POA: Diagnosis not present

## 2018-06-15 DIAGNOSIS — K4090 Unilateral inguinal hernia, without obstruction or gangrene, not specified as recurrent: Secondary | ICD-10-CM | POA: Diagnosis not present

## 2018-06-15 DIAGNOSIS — S299XXA Unspecified injury of thorax, initial encounter: Secondary | ICD-10-CM | POA: Diagnosis not present

## 2018-06-15 DIAGNOSIS — S0993XA Unspecified injury of face, initial encounter: Secondary | ICD-10-CM | POA: Diagnosis not present

## 2018-10-14 DIAGNOSIS — Z743 Need for continuous supervision: Secondary | ICD-10-CM | POA: Diagnosis not present

## 2018-10-14 DIAGNOSIS — G4700 Insomnia, unspecified: Secondary | ICD-10-CM | POA: Diagnosis not present

## 2018-10-14 DIAGNOSIS — D696 Thrombocytopenia, unspecified: Secondary | ICD-10-CM | POA: Diagnosis not present

## 2018-10-14 DIAGNOSIS — M6281 Muscle weakness (generalized): Secondary | ICD-10-CM | POA: Diagnosis not present

## 2018-10-14 DIAGNOSIS — I38 Endocarditis, valve unspecified: Secondary | ICD-10-CM | POA: Diagnosis not present

## 2018-10-14 DIAGNOSIS — E871 Hypo-osmolality and hyponatremia: Secondary | ICD-10-CM | POA: Diagnosis not present

## 2018-10-14 DIAGNOSIS — I959 Hypotension, unspecified: Secondary | ICD-10-CM | POA: Diagnosis not present

## 2018-10-14 DIAGNOSIS — M25561 Pain in right knee: Secondary | ICD-10-CM | POA: Diagnosis not present

## 2018-10-14 DIAGNOSIS — R609 Edema, unspecified: Secondary | ICD-10-CM | POA: Diagnosis not present

## 2018-10-14 DIAGNOSIS — M00869 Arthritis due to other bacteria, unspecified knee: Secondary | ICD-10-CM | POA: Diagnosis not present

## 2018-10-14 DIAGNOSIS — R7881 Bacteremia: Secondary | ICD-10-CM | POA: Diagnosis not present

## 2018-10-14 DIAGNOSIS — W19XXXA Unspecified fall, initial encounter: Secondary | ICD-10-CM | POA: Diagnosis not present

## 2018-10-14 DIAGNOSIS — M7989 Other specified soft tissue disorders: Secondary | ICD-10-CM | POA: Diagnosis not present

## 2018-10-14 DIAGNOSIS — R52 Pain, unspecified: Secondary | ICD-10-CM | POA: Diagnosis not present

## 2018-10-14 DIAGNOSIS — D6959 Other secondary thrombocytopenia: Secondary | ICD-10-CM | POA: Diagnosis not present

## 2018-10-14 DIAGNOSIS — R262 Difficulty in walking, not elsewhere classified: Secondary | ICD-10-CM | POA: Diagnosis not present

## 2018-10-14 DIAGNOSIS — M79661 Pain in right lower leg: Secondary | ICD-10-CM | POA: Diagnosis not present

## 2018-10-14 DIAGNOSIS — K7290 Hepatic failure, unspecified without coma: Secondary | ICD-10-CM | POA: Diagnosis not present

## 2018-10-14 DIAGNOSIS — R29898 Other symptoms and signs involving the musculoskeletal system: Secondary | ICD-10-CM | POA: Diagnosis not present

## 2018-10-14 DIAGNOSIS — S299XXA Unspecified injury of thorax, initial encounter: Secondary | ICD-10-CM | POA: Diagnosis not present

## 2018-10-14 DIAGNOSIS — B9562 Methicillin resistant Staphylococcus aureus infection as the cause of diseases classified elsewhere: Secondary | ICD-10-CM | POA: Diagnosis not present

## 2018-10-14 DIAGNOSIS — R531 Weakness: Secondary | ICD-10-CM | POA: Diagnosis not present

## 2018-10-14 DIAGNOSIS — U071 COVID-19: Secondary | ICD-10-CM | POA: Diagnosis not present

## 2018-10-14 DIAGNOSIS — Z1159 Encounter for screening for other viral diseases: Secondary | ICD-10-CM | POA: Diagnosis not present

## 2018-10-14 DIAGNOSIS — M00061 Staphylococcal arthritis, right knee: Secondary | ICD-10-CM | POA: Diagnosis not present

## 2018-10-14 DIAGNOSIS — M1711 Unilateral primary osteoarthritis, right knee: Secondary | ICD-10-CM | POA: Diagnosis not present

## 2018-10-14 DIAGNOSIS — K7460 Unspecified cirrhosis of liver: Secondary | ICD-10-CM | POA: Diagnosis not present

## 2018-10-15 DIAGNOSIS — M79661 Pain in right lower leg: Secondary | ICD-10-CM | POA: Diagnosis not present

## 2018-10-15 DIAGNOSIS — M7989 Other specified soft tissue disorders: Secondary | ICD-10-CM | POA: Diagnosis not present

## 2018-10-18 DIAGNOSIS — I38 Endocarditis, valve unspecified: Secondary | ICD-10-CM | POA: Diagnosis not present

## 2018-10-24 DIAGNOSIS — R262 Difficulty in walking, not elsewhere classified: Secondary | ICD-10-CM | POA: Diagnosis not present

## 2018-10-24 DIAGNOSIS — K7290 Hepatic failure, unspecified without coma: Secondary | ICD-10-CM | POA: Diagnosis not present

## 2018-10-24 DIAGNOSIS — K7460 Unspecified cirrhosis of liver: Secondary | ICD-10-CM | POA: Diagnosis not present

## 2018-10-24 DIAGNOSIS — R52 Pain, unspecified: Secondary | ICD-10-CM | POA: Diagnosis not present

## 2018-10-24 DIAGNOSIS — E871 Hypo-osmolality and hyponatremia: Secondary | ICD-10-CM | POA: Diagnosis not present

## 2018-10-24 DIAGNOSIS — I959 Hypotension, unspecified: Secondary | ICD-10-CM | POA: Diagnosis not present

## 2018-10-24 DIAGNOSIS — M00869 Arthritis due to other bacteria, unspecified knee: Secondary | ICD-10-CM | POA: Diagnosis not present

## 2018-10-24 DIAGNOSIS — I10 Essential (primary) hypertension: Secondary | ICD-10-CM | POA: Diagnosis not present

## 2018-10-24 DIAGNOSIS — B958 Unspecified staphylococcus as the cause of diseases classified elsewhere: Secondary | ICD-10-CM | POA: Diagnosis not present

## 2018-10-24 DIAGNOSIS — Z743 Need for continuous supervision: Secondary | ICD-10-CM | POA: Diagnosis not present

## 2018-10-24 DIAGNOSIS — Z1159 Encounter for screening for other viral diseases: Secondary | ICD-10-CM | POA: Diagnosis not present

## 2018-10-24 DIAGNOSIS — A419 Sepsis, unspecified organism: Secondary | ICD-10-CM | POA: Diagnosis not present

## 2018-10-24 DIAGNOSIS — M25561 Pain in right knee: Secondary | ICD-10-CM | POA: Diagnosis not present

## 2018-10-24 DIAGNOSIS — M6281 Muscle weakness (generalized): Secondary | ICD-10-CM | POA: Diagnosis not present

## 2018-10-24 DIAGNOSIS — U071 COVID-19: Secondary | ICD-10-CM | POA: Diagnosis not present

## 2018-10-24 DIAGNOSIS — R29898 Other symptoms and signs involving the musculoskeletal system: Secondary | ICD-10-CM | POA: Diagnosis not present

## 2018-10-24 DIAGNOSIS — Z5181 Encounter for therapeutic drug level monitoring: Secondary | ICD-10-CM | POA: Diagnosis not present

## 2018-10-24 DIAGNOSIS — D6959 Other secondary thrombocytopenia: Secondary | ICD-10-CM | POA: Diagnosis not present

## 2018-10-24 DIAGNOSIS — R7881 Bacteremia: Secondary | ICD-10-CM | POA: Diagnosis not present

## 2018-10-24 DIAGNOSIS — D649 Anemia, unspecified: Secondary | ICD-10-CM | POA: Diagnosis not present

## 2018-10-24 DIAGNOSIS — D696 Thrombocytopenia, unspecified: Secondary | ICD-10-CM | POA: Diagnosis not present

## 2018-10-24 DIAGNOSIS — B9562 Methicillin resistant Staphylococcus aureus infection as the cause of diseases classified elsewhere: Secondary | ICD-10-CM | POA: Diagnosis not present

## 2018-10-24 DIAGNOSIS — K219 Gastro-esophageal reflux disease without esophagitis: Secondary | ICD-10-CM | POA: Diagnosis not present

## 2018-10-24 DIAGNOSIS — G4700 Insomnia, unspecified: Secondary | ICD-10-CM | POA: Diagnosis not present

## 2018-10-24 DIAGNOSIS — Z8614 Personal history of Methicillin resistant Staphylococcus aureus infection: Secondary | ICD-10-CM | POA: Diagnosis not present

## 2018-10-24 DIAGNOSIS — B372 Candidiasis of skin and nail: Secondary | ICD-10-CM | POA: Diagnosis not present

## 2018-10-24 DIAGNOSIS — M00061 Staphylococcal arthritis, right knee: Secondary | ICD-10-CM | POA: Diagnosis not present

## 2018-10-25 DIAGNOSIS — B9562 Methicillin resistant Staphylococcus aureus infection as the cause of diseases classified elsewhere: Secondary | ICD-10-CM | POA: Diagnosis not present

## 2018-10-25 DIAGNOSIS — K7290 Hepatic failure, unspecified without coma: Secondary | ICD-10-CM | POA: Diagnosis not present

## 2018-10-25 DIAGNOSIS — D6959 Other secondary thrombocytopenia: Secondary | ICD-10-CM | POA: Diagnosis not present

## 2018-10-25 DIAGNOSIS — R7881 Bacteremia: Secondary | ICD-10-CM | POA: Diagnosis not present

## 2018-10-28 DIAGNOSIS — M00869 Arthritis due to other bacteria, unspecified knee: Secondary | ICD-10-CM | POA: Diagnosis not present

## 2018-10-29 DIAGNOSIS — M00869 Arthritis due to other bacteria, unspecified knee: Secondary | ICD-10-CM | POA: Diagnosis not present

## 2018-10-29 DIAGNOSIS — R7881 Bacteremia: Secondary | ICD-10-CM | POA: Diagnosis not present

## 2018-10-29 DIAGNOSIS — K7290 Hepatic failure, unspecified without coma: Secondary | ICD-10-CM | POA: Diagnosis not present

## 2018-10-29 DIAGNOSIS — D6959 Other secondary thrombocytopenia: Secondary | ICD-10-CM | POA: Diagnosis not present

## 2018-10-31 DIAGNOSIS — B9562 Methicillin resistant Staphylococcus aureus infection as the cause of diseases classified elsewhere: Secondary | ICD-10-CM | POA: Diagnosis not present

## 2018-10-31 DIAGNOSIS — K7290 Hepatic failure, unspecified without coma: Secondary | ICD-10-CM | POA: Diagnosis not present

## 2018-11-05 DIAGNOSIS — G4700 Insomnia, unspecified: Secondary | ICD-10-CM | POA: Diagnosis not present

## 2018-11-05 DIAGNOSIS — I10 Essential (primary) hypertension: Secondary | ICD-10-CM | POA: Diagnosis not present

## 2018-11-05 DIAGNOSIS — B9562 Methicillin resistant Staphylococcus aureus infection as the cause of diseases classified elsewhere: Secondary | ICD-10-CM | POA: Diagnosis not present

## 2018-11-05 DIAGNOSIS — B372 Candidiasis of skin and nail: Secondary | ICD-10-CM | POA: Diagnosis not present

## 2018-11-06 DIAGNOSIS — M00061 Staphylococcal arthritis, right knee: Secondary | ICD-10-CM | POA: Diagnosis not present

## 2018-11-12 DIAGNOSIS — B958 Unspecified staphylococcus as the cause of diseases classified elsewhere: Secondary | ICD-10-CM | POA: Diagnosis not present

## 2018-11-12 DIAGNOSIS — K7290 Hepatic failure, unspecified without coma: Secondary | ICD-10-CM | POA: Diagnosis not present

## 2018-11-12 DIAGNOSIS — Z8614 Personal history of Methicillin resistant Staphylococcus aureus infection: Secondary | ICD-10-CM | POA: Diagnosis not present

## 2018-11-12 DIAGNOSIS — M00869 Arthritis due to other bacteria, unspecified knee: Secondary | ICD-10-CM | POA: Diagnosis not present

## 2018-11-12 DIAGNOSIS — I10 Essential (primary) hypertension: Secondary | ICD-10-CM | POA: Diagnosis not present

## 2018-11-12 DIAGNOSIS — K219 Gastro-esophageal reflux disease without esophagitis: Secondary | ICD-10-CM | POA: Diagnosis not present

## 2018-11-12 DIAGNOSIS — M00061 Staphylococcal arthritis, right knee: Secondary | ICD-10-CM | POA: Diagnosis not present

## 2018-11-12 DIAGNOSIS — B9562 Methicillin resistant Staphylococcus aureus infection as the cause of diseases classified elsewhere: Secondary | ICD-10-CM | POA: Diagnosis not present

## 2018-11-14 DIAGNOSIS — A419 Sepsis, unspecified organism: Secondary | ICD-10-CM | POA: Diagnosis not present

## 2018-11-14 DIAGNOSIS — Z8614 Personal history of Methicillin resistant Staphylococcus aureus infection: Secondary | ICD-10-CM | POA: Diagnosis not present

## 2018-11-14 DIAGNOSIS — K7460 Unspecified cirrhosis of liver: Secondary | ICD-10-CM | POA: Diagnosis not present

## 2018-11-14 DIAGNOSIS — B958 Unspecified staphylococcus as the cause of diseases classified elsewhere: Secondary | ICD-10-CM | POA: Diagnosis not present

## 2018-11-14 DIAGNOSIS — M00061 Staphylococcal arthritis, right knee: Secondary | ICD-10-CM | POA: Diagnosis not present

## 2018-11-14 DIAGNOSIS — K219 Gastro-esophageal reflux disease without esophagitis: Secondary | ICD-10-CM | POA: Diagnosis not present

## 2018-11-14 DIAGNOSIS — I10 Essential (primary) hypertension: Secondary | ICD-10-CM | POA: Diagnosis not present

## 2018-12-12 DIAGNOSIS — K7290 Hepatic failure, unspecified without coma: Secondary | ICD-10-CM | POA: Diagnosis not present

## 2018-12-12 DIAGNOSIS — M00869 Arthritis due to other bacteria, unspecified knee: Secondary | ICD-10-CM | POA: Diagnosis not present

## 2018-12-16 DIAGNOSIS — B9562 Methicillin resistant Staphylococcus aureus infection as the cause of diseases classified elsewhere: Secondary | ICD-10-CM | POA: Diagnosis not present

## 2019-01-01 DIAGNOSIS — K7290 Hepatic failure, unspecified without coma: Secondary | ICD-10-CM | POA: Diagnosis not present

## 2019-01-01 DIAGNOSIS — G9341 Metabolic encephalopathy: Secondary | ICD-10-CM | POA: Diagnosis not present

## 2019-01-06 DIAGNOSIS — K7290 Hepatic failure, unspecified without coma: Secondary | ICD-10-CM | POA: Diagnosis not present

## 2019-01-09 DEATH — deceased
